# Patient Record
Sex: Male | Born: 1955 | Race: White | Hispanic: No | Marital: Married | Smoking: Former smoker
Health system: Southern US, Community
[De-identification: ages and names within clinical notes are randomized; demographics above are authoritative.]

## PROBLEM LIST (undated history)

## (undated) DIAGNOSIS — I10 Essential (primary) hypertension: Secondary | ICD-10-CM

## (undated) DIAGNOSIS — N200 Calculus of kidney: Secondary | ICD-10-CM

## (undated) DIAGNOSIS — R011 Cardiac murmur, unspecified: Secondary | ICD-10-CM

## (undated) DIAGNOSIS — M1990 Unspecified osteoarthritis, unspecified site: Secondary | ICD-10-CM

## (undated) DIAGNOSIS — K219 Gastro-esophageal reflux disease without esophagitis: Secondary | ICD-10-CM

## (undated) DIAGNOSIS — E119 Type 2 diabetes mellitus without complications: Secondary | ICD-10-CM

## (undated) HISTORY — PX: LITHOTRIPSY: SUR834

## (undated) HISTORY — PX: COLONOSCOPY: SHX174

---
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
## 2014-05-22 ENCOUNTER — Encounter (HOSPITAL_COMMUNITY): Payer: Self-pay | Admitting: Pharmacy Technician

## 2014-05-22 NOTE — Patient Instructions (Addendum)
Ricardo Contreras  05/22/2014   Your procedure is scheduled on:  05/30/14    Report to Cancer Center Entrance.    Follow the Signs to Short Stay Center at 0830       am  Call this number if you have problems the morning of surgery: [phone]   Remember: Eat a good healthy snack prior to bedtime.     Do not eat food or drink liquids after midnight.   Take these medicines the morning of surgery with A SIP OF WATER: Hydrocodone if needed    Do not wear jewelry,   Do not wear lotions, powders, or perfumes.  deodorant.  . Men may shave face and neck.  Do not bring valuables to the hospital.  Contacts, dentures or bridgework may not be worn into surgery.  Leave suitcase in the car. After surgery it may be brought to your room.  For patients admitted to the hospital, checkout time is 11:00 AM the day of  discharge.      [facility] - Preparing for Surgery Before surgery, you can play an important role.  Because skin is not sterile, your skin needs to be as free of germs as possible.  You can reduce the number of germs on your skin by washing with CHG (chlorahexidine gluconate) soap before surgery.  CHG is an antiseptic cleaner which kills germs and bonds with the skin to continue killing germs even after washing. Please DO NOT use if you have an allergy to CHG or antibacterial soaps.  If your skin becomes reddened/irritated stop using the CHG and inform your nurse when you arrive at Short Stay. Do not shave (including legs and underarms) for at least 48 hours prior to the first CHG shower.  You may shave your face/neck. Please follow these instructions carefully:  1.  Shower with CHG Soap the night before surgery and the  morning of Surgery.  2.  If you choose to wash your hair, wash your hair first as usual with your  normal  shampoo.  3.  After you shampoo, rinse your hair and body thoroughly to remove the  shampoo.                           4.  Use CHG as you would any other liquid soap.   You can apply chg directly  to the skin and wash                       Gently with a scrungie or clean washcloth.  5.  Apply the CHG Soap to your body ONLY FROM THE NECK DOWN.   Do not use on face/ open                           Wound or open sores. Avoid contact with eyes, ears mouth and genitals (private parts).                       Wash face,  Genitals (private parts) with your normal soap.             6.  Wash thoroughly, paying special attention to the area where your surgery  will be performed.  7.  Thoroughly rinse your body with warm water from the neck down.  8.  DO NOT shower/wash with your normal soap after using and rinsing off  the CHG Soap.                9.  Pat yourself dry with a clean towel.            10.  Wear clean pajamas.            11.  Place clean sheets on your bed the night of your first shower and do not  sleep with pets. Day of Surgery : Do not apply any lotions/deodorants the morning of surgery.  Please wear clean clothes to the hospital/surgery center.  FAILURE TO FOLLOW THESE INSTRUCTIONS MAY RESULT IN THE CANCELLATION OF YOUR SURGERY PATIENT SIGNATURE_________________________________  NURSE SIGNATURE__________________________________  ________________________________________________________________________  WHAT IS A BLOOD TRANSFUSION? Blood Transfusion Information  A transfusion is the replacement of blood or some of its parts. Blood is made up of multiple cells which provide different functions.  Red blood cells carry oxygen and are used for blood loss replacement.  White blood cells fight against infection.  Platelets control bleeding.  Plasma helps clot blood.  Other blood products are available for specialized needs, such as hemophilia or other clotting disorders. BEFORE THE TRANSFUSION  Who gives blood for transfusions?   Healthy volunteers who are fully evaluated to make sure their blood is safe. This is blood bank blood. Transfusion  therapy is the safest it has ever been in the practice of medicine. Before blood is taken from a donor, a complete history is taken to make sure that person has no history of diseases nor engages in risky social behavior (examples are intravenous drug use or sexual activity with multiple partners). The donor's travel history is screened to minimize risk of transmitting infections, such as malaria. The donated blood is tested for signs of infectious diseases, such as HIV and hepatitis. The blood is then tested to be sure it is compatible with you in order to minimize the chance of a transfusion reaction. If you or a relative donates blood, this is often done in anticipation of surgery and is not appropriate for emergency situations. It takes many days to process the donated blood. RISKS AND COMPLICATIONS Although transfusion therapy is very safe and saves many lives, the main dangers of transfusion include:   Getting an infectious disease.  Developing a transfusion reaction. This is an allergic reaction to something in the blood you were given. Every precaution is taken to prevent this. The decision to have a blood transfusion has been considered carefully by your caregiver before blood is given. Blood is not given unless the benefits outweigh the risks. AFTER THE TRANSFUSION  Right after receiving a blood transfusion, you will usually feel much better and more energetic. This is especially true if your red blood cells have gotten low (anemic). The transfusion raises the level of the red blood cells which carry oxygen, and this usually causes an energy increase.  The nurse administering the transfusion will monitor you carefully for complications. HOME CARE INSTRUCTIONS  No special instructions are needed after a transfusion. You may find your energy is better. Speak with your caregiver about any limitations on activity for underlying diseases you may have. SEEK MEDICAL CARE IF:   Your condition is  not improving after your transfusion.  You develop redness or irritation at the intravenous (IV) site. SEEK IMMEDIATE MEDICAL CARE IF:  Any of the following symptoms occur over the next 12 hours:  Shaking chills.  You have a temperature by mouth above 102 F (38.9 C), not controlled by  medicine.  Chest, back, or muscle pain.  People around you feel you are not acting correctly or are confused.  Shortness of breath or difficulty breathing.  Dizziness and fainting.  You get a rash or develop hives.  You have a decrease in urine output.  Your urine turns a dark color or changes to pink, red, or brown. Any of the following symptoms occur over the next 10 days:  You have a temperature by mouth above 102 F (38.9 C), not controlled by medicine.  Shortness of breath.  Weakness after normal activity.  The white part of the eye turns yellow (jaundice).  You have a decrease in the amount of urine or are urinating less often.  Your urine turns a dark color or changes to pink, red, or brown. Document Released: 08/01/2000 Document Revised: 10/27/2011 Document Reviewed: 03/20/2008 ExitCare Patient Information 2014 [place], [place].  _______________________________________________________________________  Incentive Spirometer  An incentive spirometer is a tool that can help keep your lungs clear and active. This tool measures how well you are filling your lungs with each breath. Taking long deep breaths may help reverse or decrease the chance of developing breathing (pulmonary) problems (especially infection) following:  A long period of time when you are unable to move or be active. BEFORE THE PROCEDURE   If the spirometer includes an indicator to show your best effort, your nurse or respiratory therapist will set it to a desired goal.  If possible, sit up straight or lean slightly forward. Try not to slouch.  Hold the incentive spirometer in an upright position. INSTRUCTIONS  FOR USE  1. Sit on the edge of your bed if possible, or sit up as far as you can in bed or on a chair. 2. Hold the incentive spirometer in an upright position. 3. Breathe out normally. 4. Place the mouthpiece in your mouth and seal your lips tightly around it. 5. Breathe in slowly and as deeply as possible, raising the piston or the ball toward the top of the column. 6. Hold your breath for 3-5 seconds or for as long as possible. Allow the piston or ball to fall to the bottom of the column. 7. Remove the mouthpiece from your mouth and breathe out normally. 8. Rest for a few seconds and repeat Steps 1 through 7 at least 10 times every 1-2 hours when you are awake. Take your time and take a few normal breaths between deep breaths. 9. The spirometer may include an indicator to show your best effort. Use the indicator as a goal to work toward during each repetition. 10. After each set of 10 deep breaths, practice coughing to be sure your lungs are clear. If you have an incision (the cut made at the time of surgery), support your incision when coughing by placing a pillow or rolled up towels firmly against it. Once you are able to get out of bed, walk around indoors and cough well. You may stop using the incentive spirometer when instructed by your caregiver.  RISKS AND COMPLICATIONS  Take your time so you do not get dizzy or light-headed.  If you are in pain, you may need to take or ask for pain medication before doing incentive spirometry. It is harder to take a deep breath if you are having pain. AFTER USE  Rest and breathe slowly and easily.  It can be helpful to keep track of a log of your progress. Your caregiver can provide you with a simple table to help with  this. If you are using the spirometer at home, follow these instructions: SEEK MEDICAL CARE IF:   You are having difficultly using the spirometer.  You have trouble using the spirometer as often as instructed.  Your pain  medication is not giving enough relief while using the spirometer.  You develop fever of 100.5 F (38.1 C) or higher. SEEK IMMEDIATE MEDICAL CARE IF:   You cough up bloody sputum that had not been present before.  You develop fever of 102 F (38.9 C) or greater.  You develop worsening pain at or near the incision site. MAKE SURE YOU:   Understand these instructions.  Will watch your condition.  Will get help right away if you are not doing well or get worse. Document Released: 12/15/2006 Document Revised: 10/27/2011 Document Reviewed: 02/15/2007 ExitCare Patient Information 2014 [employer], [place].   ________________________________________________________________________    Please read over the following fact sheets that you were given: MRSA Information, coughing and deep breathing exercises, leg exercises

## 2014-05-23 ENCOUNTER — Ambulatory Visit (HOSPITAL_COMMUNITY)
Admission: RE | Admit: 2014-05-23 | Discharge: 2014-05-23 | Disposition: A | Discharge status: Home / Self Care | Payer: BC Managed Care – PPO | Source: Ambulatory Visit | Attending: Orthopedic Surgery | Admitting: Orthopedic Surgery

## 2014-05-23 ENCOUNTER — Encounter (HOSPITAL_COMMUNITY)
Admission: RE | Admit: 2014-05-23 | Discharge: 2014-05-23 | Disposition: A | Discharge status: Home / Self Care | Payer: BC Managed Care – PPO | Source: Ambulatory Visit | Attending: Orthopedic Surgery | Admitting: Orthopedic Surgery

## 2014-05-23 ENCOUNTER — Encounter (HOSPITAL_COMMUNITY): Payer: Self-pay

## 2014-05-23 DIAGNOSIS — Z87891 Personal history of nicotine dependence: Secondary | ICD-10-CM | POA: Insufficient documentation

## 2014-05-23 DIAGNOSIS — Z01812 Encounter for preprocedural laboratory examination: Secondary | ICD-10-CM | POA: Insufficient documentation

## 2014-05-23 DIAGNOSIS — Z794 Long term (current) use of insulin: Secondary | ICD-10-CM | POA: Diagnosis not present

## 2014-05-23 DIAGNOSIS — M1611 Unilateral primary osteoarthritis, right hip: Secondary | ICD-10-CM | POA: Diagnosis present

## 2014-05-23 DIAGNOSIS — E119 Type 2 diabetes mellitus without complications: Secondary | ICD-10-CM | POA: Insufficient documentation

## 2014-05-23 DIAGNOSIS — I10 Essential (primary) hypertension: Secondary | ICD-10-CM | POA: Diagnosis not present

## 2014-05-23 DIAGNOSIS — Z01810 Encounter for preprocedural cardiovascular examination: Secondary | ICD-10-CM | POA: Insufficient documentation

## 2014-05-23 HISTORY — DX: Cardiac murmur, unspecified: R01.1

## 2014-05-23 HISTORY — DX: Essential (primary) hypertension: I10

## 2014-05-23 HISTORY — DX: Type 2 diabetes mellitus without complications: E11.9

## 2014-05-23 HISTORY — DX: Gastro-esophageal reflux disease without esophagitis: K21.9

## 2014-05-23 HISTORY — DX: Calculus of kidney: N20.0

## 2014-05-23 HISTORY — DX: Unspecified osteoarthritis, unspecified site: M19.90

## 2014-05-23 LAB — URINE MICROSCOPIC-ADD ON

## 2014-05-23 LAB — URINALYSIS, ROUTINE W REFLEX MICROSCOPIC
GLUCOSE, UA: NEGATIVE mg/dL
Ketones, ur: NEGATIVE mg/dL
Nitrite: NEGATIVE
PH: 5.5 (ref 5.0–8.0)
PROTEIN: NEGATIVE mg/dL
Specific Gravity, Urine: 1.016 (ref 1.005–1.030)
Urobilinogen, UA: 0.2 mg/dL (ref 0.0–1.0)

## 2014-05-23 LAB — BASIC METABOLIC PANEL
ANION GAP: 13 (ref 5–15)
BUN: 23 mg/dL (ref 6–23)
CO2: 25 mEq/L (ref 19–32)
Calcium: 9.7 mg/dL (ref 8.4–10.5)
Chloride: 98 mEq/L (ref 96–112)
Creatinine, Ser: 1.09 mg/dL (ref 0.50–1.35)
GFR, EST AFRICAN AMERICAN: 85 mL/min — AB (ref >90)
GFR, EST NON AFRICAN AMERICAN: 73 mL/min — AB (ref >90)
Glucose, Bld: 93 mg/dL (ref 70–99)
Potassium: 4.7 mEq/L (ref 3.7–5.3)
Sodium: 136 mEq/L — ABNORMAL LOW (ref 137–147)

## 2014-05-23 LAB — CBC
HCT: 39.9 % (ref 39.0–52.0)
HEMOGLOBIN: 14.1 g/dL (ref 13.0–17.0)
MCH: 32.7 pg (ref 26.0–34.0)
MCHC: 35.3 g/dL (ref 30.0–36.0)
MCV: 92.6 fL (ref 78.0–100.0)
Platelets: 158 10*3/uL (ref 150–400)
RBC: 4.31 MIL/uL (ref 4.22–5.81)
RDW: 13.4 % (ref 11.5–15.5)
WBC: 8 10*3/uL (ref 4.0–10.5)

## 2014-05-23 LAB — PROTIME-INR
INR: 1.04 (ref 0.00–1.49)
PROTHROMBIN TIME: 13.8 s (ref 11.6–15.2)

## 2014-05-23 LAB — APTT: aPTT: 36 seconds (ref 24–37)

## 2014-05-23 LAB — SURGICAL PCR SCREEN
MRSA, PCR: NEGATIVE
STAPHYLOCOCCUS AUREUS: NEGATIVE

## 2014-05-23 LAB — ABO/RH: ABO/RH(D): O NEG

## 2014-05-23 NOTE — Progress Notes (Signed)
At time of preop appointment done 05/23/2014 patient scored a "7" on STOP BANG TOOL for SLEEP APNEA which means patient is at an elevated risk .  Thank You.

## 2014-05-23 NOTE — Progress Notes (Signed)
U/A with micro results faxed via EPIC to Dr Charlann Boxerolin done 05/23/2014.

## 2014-05-23 NOTE — Progress Notes (Signed)
Dr Beckey DowningWillett 04/21/2014 clearance on chart.

## 2014-05-24 NOTE — Progress Notes (Signed)
[facility]

## 2014-05-27 NOTE — H&P (Signed)
TOTAL HIP ADMISSION H&P  Patient is admitted for right total hip arthroplasty, anterior approach.  Subjective:  Chief Complaint:   Right hip OA / pain  HPI: Ricardo Contreras, 58 y.o. male, has a history of pain and functional disability in the right hip(s) due to arthritis and patient has failed non-surgical conservative treatments for greater than 12 weeks to include NSAID's and/or analgesics, use of assistive devices and activity modification.  Onset of symptoms was gradual starting >10 years ago with gradually worsening course since that time.The patient noted no past surgery on the right hip(s).  Patient currently rates pain in the right hip at 8 out of 10 with activity. Patient has night pain, worsening of pain with activity and weight bearing, trendelenberg gait, pain that interfers with activities of daily living and pain with passive range of motion. Patient has evidence of periarticular osteophytes and joint space narrowing by imaging studies. This condition presents safety issues increasing the risk of falls. There is no current active infection.  Risks, benefits and expectations were discussed with the patient.  Risks including but not limited to the risk of anesthesia, blood clots, nerve damage, blood vessel damage, failure of the prosthesis, infection and up to and including death.  Patient understand the risks, benefits and expectations and wishes to proceed with surgery.   PCP: Mattie MarlinWILLETT,RALPH, MD  D/C Plans:      Home with HHPT  Post-op Meds:       No Rx given   Tranexamic Acid:      To be given - IV    Decadron:      Is to be given  FYI:     ASA post-op  Norco post-op    Past Medical History  Diagnosis Date  . Diabetes mellitus without complication   . Hypertension   . Heart murmur     hx of as a child   . Kidney stones   . GERD (gastroesophageal reflux disease)   . Arthritis     Past Surgical History  Procedure Laterality Date  . Tonsillectomy  1970  .  Lithotripsy    . Colonoscopy      No prescriptions prior to admission   No Known Allergies   History  Substance Use Topics  . Smoking status: Former Smoker    Quit date: 08/18/2000  . Smokeless tobacco: Never Used  . Alcohol Use: Yes     Comment: 5 days per week has a shot of whiskey    No family history on file.   Review of Systems  Constitutional: Negative.   HENT: Negative.   Eyes: Negative.   Respiratory: Negative.   Cardiovascular: Negative.   Gastrointestinal: Positive for heartburn.  Genitourinary: Negative.   Musculoskeletal: Positive for back pain and joint pain.  Skin: Negative.   Neurological: Negative.   Endo/Heme/Allergies: Negative.   Psychiatric/Behavioral: Negative.     Objective:  Physical Exam  Constitutional: He is oriented to person, place, and time. He appears well-developed and well-nourished.  HENT:  Head: Normocephalic and atraumatic.  Eyes: Pupils are equal, round, and reactive to light.  Neck: Neck supple. No JVD present. No tracheal deviation present. No thyromegaly present.  Cardiovascular: Normal rate, regular rhythm, normal heart sounds and intact distal pulses.   Respiratory: Effort normal and breath sounds normal. No respiratory distress. He has no wheezes.  GI: Soft. There is no tenderness. There is no guarding.  Musculoskeletal:       Right hip: He exhibits decreased range  of motion, decreased strength, tenderness and bony tenderness. He exhibits no swelling, no deformity and no laceration.  Lymphadenopathy:    He has no cervical adenopathy.  Neurological: He is alert and oriented to person, place, and time.  Skin: Skin is warm and dry.  Psychiatric: He has a normal mood and affect.      Imaging Review Plain radiographs demonstrate severe degenerative joint disease of the right hip(s). The bone quality appears to be good for age and reported activity level.  Assessment/Plan:  End stage arthritis, right hip(s)  The patient  history, physical examination, clinical judgement of the provider and imaging studies are consistent with end stage degenerative joint disease of the right hip(s) and total hip arthroplasty is deemed medically necessary. The treatment options including medical management, injection therapy, arthroscopy and arthroplasty were discussed at length. The risks and benefits of total hip arthroplasty were presented and reviewed. The risks due to aseptic loosening, infection, stiffness, dislocation/subluxation,  thromboembolic complications and other imponderables were discussed.  The patient acknowledged the explanation, agreed to proceed with the plan and consent was signed. Patient is being admitted for inpatient treatment for surgery, pain control, PT, OT, prophylactic antibiotics, VTE prophylaxis, progressive ambulation and ADL's and discharge planning.The patient is planning to be discharged home with home health services.     Anastasio AuerbachMatthew S. Ramond Darnell   PA-C  05/27/2014, 3:20 PM

## 2014-05-29 MED ORDER — DEXTROSE 5 % IV SOLN
3.0000 g | INTRAVENOUS | Status: AC
  Administered 2014-05-30: 3 g via INTRAVENOUS
  Filled 2014-05-29 (×2): qty 3000

## 2014-05-30 ENCOUNTER — Encounter (HOSPITAL_COMMUNITY): Payer: Self-pay | Admitting: *Deleted

## 2014-05-30 ENCOUNTER — Inpatient Hospital Stay (HOSPITAL_COMMUNITY)
Admission: RE | Admit: 2014-05-30 | Discharge: 2014-05-31 | DRG: 470 | Disposition: A | Discharge status: Home Health | Payer: BC Managed Care – PPO | Source: Ambulatory Visit | Attending: Orthopedic Surgery | Admitting: Orthopedic Surgery

## 2014-05-30 ENCOUNTER — Encounter (HOSPITAL_COMMUNITY): Payer: BC Managed Care – PPO | Admitting: Anesthesiology

## 2014-05-30 ENCOUNTER — Encounter (HOSPITAL_COMMUNITY)
Admission: RE | Disposition: A | Discharge status: Home Health | Payer: Self-pay | Source: Ambulatory Visit | Attending: Orthopedic Surgery

## 2014-05-30 ENCOUNTER — Inpatient Hospital Stay (HOSPITAL_COMMUNITY): Payer: BC Managed Care – PPO

## 2014-05-30 ENCOUNTER — Inpatient Hospital Stay (HOSPITAL_COMMUNITY): Payer: BC Managed Care – PPO | Admitting: Anesthesiology

## 2014-05-30 DIAGNOSIS — M1611 Unilateral primary osteoarthritis, right hip: Secondary | ICD-10-CM | POA: Diagnosis present

## 2014-05-30 DIAGNOSIS — Z87891 Personal history of nicotine dependence: Secondary | ICD-10-CM

## 2014-05-30 DIAGNOSIS — Z96649 Presence of unspecified artificial hip joint: Secondary | ICD-10-CM

## 2014-05-30 DIAGNOSIS — K219 Gastro-esophageal reflux disease without esophagitis: Secondary | ICD-10-CM | POA: Diagnosis present

## 2014-05-30 DIAGNOSIS — Z87442 Personal history of urinary calculi: Secondary | ICD-10-CM

## 2014-05-30 DIAGNOSIS — E119 Type 2 diabetes mellitus without complications: Secondary | ICD-10-CM | POA: Diagnosis present

## 2014-05-30 DIAGNOSIS — I10 Essential (primary) hypertension: Secondary | ICD-10-CM | POA: Diagnosis present

## 2014-05-30 DIAGNOSIS — Z6841 Body Mass Index (BMI) 40.0 and over, adult: Secondary | ICD-10-CM | POA: Diagnosis not present

## 2014-05-30 DIAGNOSIS — M25551 Pain in right hip: Secondary | ICD-10-CM | POA: Diagnosis present

## 2014-05-30 HISTORY — PX: TOTAL HIP ARTHROPLASTY: SHX124

## 2014-05-30 LAB — GLUCOSE, CAPILLARY
GLUCOSE-CAPILLARY: 129 mg/dL — AB (ref 70–99)
Glucose-Capillary: 157 mg/dL — ABNORMAL HIGH (ref 70–99)
Glucose-Capillary: 186 mg/dL — ABNORMAL HIGH (ref 70–99)

## 2014-05-30 LAB — TYPE AND SCREEN
ABO/RH(D): O NEG
Antibody Screen: NEGATIVE

## 2014-05-30 SURGERY — ARTHROPLASTY, HIP, TOTAL, ANTERIOR APPROACH
Anesthesia: General | Site: Hip | Laterality: Right

## 2014-05-30 MED ORDER — ONDANSETRON HCL 4 MG/2ML IJ SOLN: 4.0000 mg | Freq: Four times a day (QID) | INTRAMUSCULAR | Status: DC | PRN

## 2014-05-30 MED ORDER — MIDAZOLAM HCL 2 MG/2ML IJ SOLN
INTRAMUSCULAR | Status: AC
  Filled 2014-05-30: qty 2

## 2014-05-30 MED ORDER — PROPOFOL 10 MG/ML IV BOLUS
INTRAVENOUS | Status: AC
  Filled 2014-05-30: qty 20

## 2014-05-30 MED ORDER — METHOCARBAMOL 500 MG PO TABS
500.0000 mg | ORAL_TABLET | Freq: Four times a day (QID) | ORAL | Status: DC | PRN
  Administered 2014-05-30 – 2014-05-31 (×2): 500 mg via ORAL
  Filled 2014-05-30 (×2): qty 1

## 2014-05-30 MED ORDER — DOCUSATE SODIUM 100 MG PO CAPS
100.0000 mg | ORAL_CAPSULE | Freq: Two times a day (BID) | ORAL | Status: DC
  Administered 2014-05-30 – 2014-05-31 (×2): 100 mg via ORAL

## 2014-05-30 MED ORDER — METOCLOPRAMIDE HCL 5 MG/ML IJ SOLN: 5.0000 mg | Freq: Three times a day (TID) | INTRAMUSCULAR | Status: DC | PRN

## 2014-05-30 MED ORDER — PROPOFOL INFUSION 10 MG/ML OPTIME
INTRAVENOUS | Status: DC | PRN
  Administered 2014-05-30: 100 ug/kg/min via INTRAVENOUS

## 2014-05-30 MED ORDER — METHOCARBAMOL 1000 MG/10ML IJ SOLN
500.0000 mg | Freq: Four times a day (QID) | INTRAVENOUS | Status: DC | PRN
  Administered 2014-05-30: 500 mg via INTRAVENOUS
  Filled 2014-05-30: qty 5

## 2014-05-30 MED ORDER — FERROUS SULFATE 325 (65 FE) MG PO TABS
325.0000 mg | ORAL_TABLET | Freq: Three times a day (TID) | ORAL | Status: DC
Start: 2014-05-30 — End: 2014-05-31
  Administered 2014-05-30 – 2014-05-31 (×2): 325 mg via ORAL
  Filled 2014-05-30 (×5): qty 1

## 2014-05-30 MED ORDER — PROMETHAZINE HCL 25 MG/ML IJ SOLN: 6.2500 mg | INTRAMUSCULAR | Status: DC | PRN

## 2014-05-30 MED ORDER — FENTANYL CITRATE 0.05 MG/ML IJ SOLN
INTRAMUSCULAR | Status: AC
  Filled 2014-05-30: qty 2

## 2014-05-30 MED ORDER — INSULIN ASPART 100 UNIT/ML ~~LOC~~ SOLN
4.0000 [IU] | Freq: Three times a day (TID) | SUBCUTANEOUS | Status: DC
  Administered 2014-05-30 – 2014-05-31 (×2): 4 [IU] via SUBCUTANEOUS

## 2014-05-30 MED ORDER — GEMFIBROZIL 600 MG PO TABS
600.0000 mg | ORAL_TABLET | Freq: Two times a day (BID) | ORAL | Status: DC
  Administered 2014-05-30 – 2014-05-31 (×2): 600 mg via ORAL
  Filled 2014-05-30 (×4): qty 1

## 2014-05-30 MED ORDER — FENTANYL CITRATE 0.05 MG/ML IJ SOLN
25.0000 ug | INTRAMUSCULAR | Status: DC | PRN
  Administered 2014-05-30 (×2): 50 ug via INTRAVENOUS

## 2014-05-30 MED ORDER — METFORMIN HCL 500 MG PO TABS
1000.0000 mg | ORAL_TABLET | Freq: Two times a day (BID) | ORAL | Status: DC
  Administered 2014-05-30 – 2014-05-31 (×2): 1000 mg via ORAL
  Filled 2014-05-30 (×4): qty 2

## 2014-05-30 MED ORDER — METOCLOPRAMIDE HCL 10 MG PO TABS: 5.0000 mg | ORAL_TABLET | Freq: Three times a day (TID) | ORAL | Status: DC | PRN

## 2014-05-30 MED ORDER — OXYCODONE HCL 5 MG PO TABS: 5.0000 mg | ORAL_TABLET | ORAL | Status: DC | PRN

## 2014-05-30 MED ORDER — DIPHENHYDRAMINE HCL 25 MG PO CAPS: 25.0000 mg | ORAL_CAPSULE | Freq: Four times a day (QID) | ORAL | Status: DC | PRN

## 2014-05-30 MED ORDER — DEXAMETHASONE SODIUM PHOSPHATE 10 MG/ML IJ SOLN
INTRAMUSCULAR | Status: AC
  Filled 2014-05-30: qty 1

## 2014-05-30 MED ORDER — ONDANSETRON HCL 4 MG PO TABS: 4.0000 mg | ORAL_TABLET | Freq: Four times a day (QID) | ORAL | Status: DC | PRN

## 2014-05-30 MED ORDER — LACTATED RINGERS IV SOLN: INTRAVENOUS | Status: DC

## 2014-05-30 MED ORDER — ZOLPIDEM TARTRATE 5 MG PO TABS: 5.0000 mg | ORAL_TABLET | Freq: Every evening | ORAL | Status: DC | PRN

## 2014-05-30 MED ORDER — ACETAMINOPHEN 325 MG PO TABS: 650.0000 mg | ORAL_TABLET | Freq: Four times a day (QID) | ORAL | Status: DC | PRN

## 2014-05-30 MED ORDER — SODIUM CHLORIDE 0.9 % IR SOLN
Status: DC | PRN
  Administered 2014-05-30: 1000 mL

## 2014-05-30 MED ORDER — ASPIRIN EC 325 MG PO TBEC
325.0000 mg | DELAYED_RELEASE_TABLET | Freq: Two times a day (BID) | ORAL | Status: DC
  Administered 2014-05-31: 325 mg via ORAL
  Filled 2014-05-30 (×3): qty 1

## 2014-05-30 MED ORDER — MAGNESIUM CITRATE PO SOLN: 1.0000 | Freq: Once | ORAL | Status: AC | PRN

## 2014-05-30 MED ORDER — HYDROMORPHONE HCL 1 MG/ML IJ SOLN
0.5000 mg | INTRAMUSCULAR | Status: DC | PRN
  Administered 2014-05-30: 1 mg via INTRAVENOUS
  Filled 2014-05-30: qty 1

## 2014-05-30 MED ORDER — INSULIN ASPART 100 UNIT/ML ~~LOC~~ SOLN
0.0000 [IU] | Freq: Three times a day (TID) | SUBCUTANEOUS | Status: DC
  Administered 2014-05-30 – 2014-05-31 (×2): 3 [IU] via SUBCUTANEOUS

## 2014-05-30 MED ORDER — SODIUM CHLORIDE 0.9 % IV SOLN
1000.0000 mg | Freq: Once | INTRAVENOUS | Status: AC
  Administered 2014-05-30: 1000 mg via INTRAVENOUS
  Filled 2014-05-30: qty 10

## 2014-05-30 MED ORDER — CHLORHEXIDINE GLUCONATE 4 % EX LIQD: 60.0000 mL | Freq: Once | CUTANEOUS | Status: DC

## 2014-05-30 MED ORDER — CEFAZOLIN SODIUM-DEXTROSE 2-3 GM-% IV SOLR
2.0000 g | Freq: Four times a day (QID) | INTRAVENOUS | Status: AC
  Administered 2014-05-30 (×2): 2 g via INTRAVENOUS
  Filled 2014-05-30 (×2): qty 50

## 2014-05-30 MED ORDER — FENTANYL CITRATE 0.05 MG/ML IJ SOLN
INTRAMUSCULAR | Status: DC | PRN
  Administered 2014-05-30: 50 ug via INTRAVENOUS

## 2014-05-30 MED ORDER — LORATADINE 10 MG PO TABS
10.0000 mg | ORAL_TABLET | Freq: Every day | ORAL | Status: DC
  Administered 2014-05-30 – 2014-05-31 (×2): 10 mg via ORAL
  Filled 2014-05-30 (×2): qty 1

## 2014-05-30 MED ORDER — ACETAMINOPHEN 500 MG PO TABS: 1000.0000 mg | ORAL_TABLET | Freq: Four times a day (QID) | ORAL | Status: DC

## 2014-05-30 MED ORDER — BISACODYL 10 MG RE SUPP: 10.0000 mg | Freq: Every day | RECTAL | Status: DC | PRN

## 2014-05-30 MED ORDER — PROPOFOL 10 MG/ML IV BOLUS
INTRAVENOUS | Status: DC | PRN
  Administered 2014-05-30 (×2): 20 mg via INTRAVENOUS

## 2014-05-30 MED ORDER — ALUM & MAG HYDROXIDE-SIMETH 200-200-20 MG/5ML PO SUSP: 30.0000 mL | ORAL | Status: DC | PRN

## 2014-05-30 MED ORDER — ONDANSETRON HCL 4 MG/2ML IJ SOLN
INTRAMUSCULAR | Status: AC
Start: 2014-05-30 — End: 2014-05-30
  Filled 2014-05-30: qty 2

## 2014-05-30 MED ORDER — PHENOL 1.4 % MT LIQD
1.0000 | OROMUCOSAL | Status: DC | PRN
  Filled 2014-05-30: qty 177

## 2014-05-30 MED ORDER — MIDAZOLAM HCL 5 MG/5ML IJ SOLN
INTRAMUSCULAR | Status: DC | PRN
  Administered 2014-05-30: 2 mg via INTRAVENOUS

## 2014-05-30 MED ORDER — CELECOXIB 200 MG PO CAPS
200.0000 mg | ORAL_CAPSULE | Freq: Two times a day (BID) | ORAL | Status: DC
  Administered 2014-05-30 – 2014-05-31 (×2): 200 mg via ORAL
  Filled 2014-05-30 (×4): qty 1

## 2014-05-30 MED ORDER — LIDOCAINE HCL (CARDIAC) 20 MG/ML IV SOLN
INTRAVENOUS | Status: DC | PRN
  Administered 2014-05-30: 20 mg via INTRAVENOUS

## 2014-05-30 MED ORDER — LINAGLIPTIN 5 MG PO TABS
5.0000 mg | ORAL_TABLET | Freq: Every day | ORAL | Status: DC
Start: 2014-05-30 — End: 2014-05-31
  Administered 2014-05-30 – 2014-05-31 (×2): 5 mg via ORAL
  Filled 2014-05-30 (×2): qty 1

## 2014-05-30 MED ORDER — ONDANSETRON HCL 4 MG/2ML IJ SOLN
INTRAMUSCULAR | Status: DC | PRN
  Administered 2014-05-30: 4 mg via INTRAVENOUS

## 2014-05-30 MED ORDER — DEXAMETHASONE SODIUM PHOSPHATE 10 MG/ML IJ SOLN
10.0000 mg | Freq: Once | INTRAMUSCULAR | Status: AC
  Administered 2014-05-30: 10 mg via INTRAVENOUS

## 2014-05-30 MED ORDER — SODIUM CHLORIDE 0.9 % IV SOLN
INTRAVENOUS | Status: DC
  Administered 2014-05-30: 19:00:00 via INTRAVENOUS
  Filled 2014-05-30 (×3): qty 1000

## 2014-05-30 MED ORDER — MENTHOL 3 MG MT LOZG
1.0000 | LOZENGE | OROMUCOSAL | Status: DC | PRN
  Filled 2014-05-30: qty 9

## 2014-05-30 MED ORDER — ACETAMINOPHEN 650 MG RE SUPP: 650.0000 mg | Freq: Four times a day (QID) | RECTAL | Status: DC | PRN

## 2014-05-30 MED ORDER — DEXAMETHASONE SODIUM PHOSPHATE 10 MG/ML IJ SOLN
10.0000 mg | Freq: Once | INTRAMUSCULAR | Status: AC
  Administered 2014-05-31: 10 mg via INTRAVENOUS
  Filled 2014-05-30: qty 1

## 2014-05-30 MED ORDER — BUPIVACAINE HCL (PF) 0.5 % IJ SOLN
INTRAMUSCULAR | Status: DC | PRN
  Administered 2014-05-30: 3 mL

## 2014-05-30 MED ORDER — INSULIN ASPART 100 UNIT/ML ~~LOC~~ SOLN
0.0000 [IU] | Freq: Every day | SUBCUTANEOUS | Status: DC
  Administered 2014-05-30: 2 [IU] via SUBCUTANEOUS

## 2014-05-30 MED ORDER — HYDROCODONE-ACETAMINOPHEN 7.5-325 MG PO TABS
1.0000 | ORAL_TABLET | ORAL | Status: DC
  Administered 2014-05-30 – 2014-05-31 (×6): 2 via ORAL
  Filled 2014-05-30 (×6): qty 2

## 2014-05-30 MED ORDER — BUPIVACAINE HCL (PF) 0.5 % IJ SOLN
INTRAMUSCULAR | Status: AC
  Filled 2014-05-30: qty 30

## 2014-05-30 MED ORDER — PREGABALIN 50 MG PO CAPS
50.0000 mg | ORAL_CAPSULE | Freq: Two times a day (BID) | ORAL | Status: DC
  Administered 2014-05-30 – 2014-05-31 (×2): 50 mg via ORAL
  Filled 2014-05-30 (×2): qty 1

## 2014-05-30 MED ORDER — POLYETHYLENE GLYCOL 3350 17 G PO PACK: 17.0000 g | PACK | Freq: Every day | ORAL | Status: DC | PRN

## 2014-05-30 MED ORDER — LACTATED RINGERS IV SOLN
INTRAVENOUS | Status: DC | PRN
  Administered 2014-05-30 (×3): via INTRAVENOUS

## 2014-05-30 MED ORDER — INFLUENZA VAC SPLIT QUAD 0.5 ML IM SUSY
0.5000 mL | PREFILLED_SYRINGE | INTRAMUSCULAR | Status: DC
  Filled 2014-05-30 (×2): qty 0.5

## 2014-05-30 SURGICAL SUPPLY — 37 items
BAG ZIPLOCK 12X15 (MISCELLANEOUS) IMPLANT
CAPT HIP PF MOP ×3 IMPLANT
COVER PERINEAL POST (MISCELLANEOUS) ×3 IMPLANT
DERMABOND ADVANCED (GAUZE/BANDAGES/DRESSINGS) ×2
DERMABOND ADVANCED .7 DNX12 (GAUZE/BANDAGES/DRESSINGS) ×1 IMPLANT
DRAPE C-ARM 42X120 X-RAY (DRAPES) ×3 IMPLANT
DRAPE STERI IOBAN 125X83 (DRAPES) ×3 IMPLANT
DRAPE U-SHAPE 47X51 STRL (DRAPES) ×9 IMPLANT
DRSG AQUACEL AG ADV 3.5X10 (GAUZE/BANDAGES/DRESSINGS) ×3 IMPLANT
DURAPREP 26ML APPLICATOR (WOUND CARE) ×3 IMPLANT
ELECT BLADE TIP CTD 4 INCH (ELECTRODE) ×3 IMPLANT
ELECT PENCIL ROCKER SW 15FT (MISCELLANEOUS) IMPLANT
ELECT REM PT RETURN 15FT ADLT (MISCELLANEOUS) IMPLANT
ELECT REM PT RETURN 9FT ADLT (ELECTROSURGICAL) ×3
ELECTRODE REM PT RTRN 9FT ADLT (ELECTROSURGICAL) ×1 IMPLANT
FACESHIELD WRAPAROUND (MASK) ×12 IMPLANT
GLOVE BIOGEL PI IND STRL 7.5 (GLOVE) ×1 IMPLANT
GLOVE BIOGEL PI IND STRL 8.5 (GLOVE) ×1 IMPLANT
GLOVE BIOGEL PI INDICATOR 7.5 (GLOVE) ×2
GLOVE BIOGEL PI INDICATOR 8.5 (GLOVE) ×2
GLOVE ECLIPSE 8.0 STRL XLNG CF (GLOVE) ×3 IMPLANT
GLOVE ORTHO TXT STRL SZ7.5 (GLOVE) ×6 IMPLANT
GOWN SPEC L3 XXLG W/TWL (GOWN DISPOSABLE) ×3 IMPLANT
GOWN STRL REUS W/TWL LRG LVL3 (GOWN DISPOSABLE) ×3 IMPLANT
HOLDER FOLEY CATH W/STRAP (MISCELLANEOUS) ×3 IMPLANT
KIT BASIN OR (CUSTOM PROCEDURE TRAY) ×3 IMPLANT
PACK TOTAL JOINT (CUSTOM PROCEDURE TRAY) ×3 IMPLANT
SAW OSC TIP CART 19.5X105X1.3 (SAW) ×3 IMPLANT
SUT MNCRL AB 4-0 PS2 18 (SUTURE) ×3 IMPLANT
SUT VIC AB 1 CT1 36 (SUTURE) ×9 IMPLANT
SUT VIC AB 2-0 CT1 27 (SUTURE) ×6
SUT VIC AB 2-0 CT1 TAPERPNT 27 (SUTURE) ×3 IMPLANT
SUT VLOC 180 0 24IN GS25 (SUTURE) ×3 IMPLANT
TOWEL OR 17X26 10 PK STRL BLUE (TOWEL DISPOSABLE) ×3 IMPLANT
TOWEL OR NON WOVEN STRL DISP B (DISPOSABLE) IMPLANT
TRAY FOLEY CATH 14FRSI W/METER (CATHETERS) ×3 IMPLANT
WATER STERILE IRR 1500ML POUR (IV SOLUTION) ×3 IMPLANT

## 2014-05-30 NOTE — Interval H&P Note (Signed)
History and Physical Interval Note:  05/30/2014 9:33 AM  Ricardo MasonAlbert L Contreras  has presented today for surgery, with the diagnosis of RIGHT HIP OA  The various methods of treatment have been discussed with the patient and family. After consideration of risks, benefits and other options for treatment, the patient has consented to  Procedure(s): RIGHT TOTAL HIP ARTHROPLASTY ANTERIOR APPROACH (Right) as a surgical intervention .  The patient's history has been reviewed, patient examined, no change in status, stable for surgery.  I have reviewed the patient's chart and labs.  Questions were answered to the patient's satisfaction.     [name],Nikolaus Pienta D

## 2014-05-30 NOTE — Evaluation (Signed)
Physical Therapy Evaluation Patient Details Name: Ricardo Contreras MRN: [id number] DOB: 17-Dec-1955 Today's Date: 05/30/2014   History of Present Illness  58 yo male s/p R THA-DA 05/30/14  Clinical Impression  On eval, pt required Min assist for mobility-able to ambulate ~60 feet with rolling walker. Tolerated activity well. Plan is possibly for d/c home on tomorrow.     Follow Up Recommendations Home health PT    Equipment Recommendations  Rolling walker with 5" wheels (bariatric)    Recommendations for Other Services OT consult     Precautions / Restrictions Precautions Precautions: Fall Restrictions Weight Bearing Restrictions: No RLE Weight Bearing: Weight bearing as tolerated      Mobility  Bed Mobility Overal bed mobility: Needs Assistance Bed Mobility: Supine to Sit     Supine to sit: Min assist     General bed mobility comments: assist for R LE  Transfers Overall transfer level: Needs assistance Equipment used: Rolling walker (2 wheeled) Transfers: Sit to/from Stand Sit to Stand: Min assist         General transfer comment: assist to rise, stabilize, control descent. VCs safety, hand placement  Ambulation/Gait Ambulation/Gait assistance: Min guard Ambulation Distance (Feet): 60 Feet Assistive device: Rolling walker (2 wheeled) Gait Pattern/deviations: Step-to pattern;Decreased stride length;Antalgic     General Gait Details: slow gait speed. VCS safety, technique, sequence  Stairs            Wheelchair Mobility    Modified Rankin (Stroke Patients Only)       Balance                                             Pertinent Vitals/Pain Pain Assessment: 0-10 Pain Score: 5  Pain Location: R hip Pain Intervention(s): Monitored during session;Ice applied    Home Living Family/patient expects to be discharged to:: Private residence Living Arrangements: Spouse/significant other   Type of Home: House Home Access:  Stairs to enter   Secretary/administratorntrance Stairs-Number of Steps: 2 Home Layout: One level Home Equipment: Walker - standard;Cane - single point      Prior Function Level of Independence: Independent               Hand Dominance        Extremity/Trunk Assessment   Upper Extremity Assessment: Overall WFL for tasks assessed           Lower Extremity Assessment: RLE deficits/detail RLE Deficits / Details: hip flex 2/5, hip abd/add 2/5, moves ankle well    Cervical / Trunk Assessment: Normal  Communication   Communication: No difficulties  Cognition Arousal/Alertness: Awake/alert Behavior During Therapy: WFL for tasks assessed/performed Overall Cognitive Status: Within Functional Limits for tasks assessed                      General Comments      Exercises        Assessment/Plan    PT Assessment Patient needs continued PT services  PT Diagnosis Difficulty walking;Acute pain   PT Problem List Decreased strength;Decreased range of motion;Decreased activity tolerance;Decreased balance;Obesity;Decreased knowledge of use of DME;Pain  PT Treatment Interventions DME instruction;Gait training;Stair training;Functional mobility training;Therapeutic activities;Therapeutic exercise;Patient/family education;Balance training   PT Goals (Current goals can be found in the Care Plan section) Acute Rehab PT Goals Patient Stated Goal: home tomorrow PT Goal Formulation: With patient/family Time For Goal Achievement:  06/06/14 Potential to Achieve Goals: Good    Frequency 7X/week   Barriers to discharge        Co-evaluation               End of Session   Activity Tolerance: Patient tolerated treatment well Patient left: in chair;with call bell/phone within reach;with family/visitor present           Time: 1738-1800 PT Time Calculation (min): 22 min   Charges:   PT Evaluation $Initial PT Evaluation Tier I: 1 Procedure PT Treatments $Gait Training: 8-22  mins   PT G Codes:          Rebeca AlertJannie Arilyn Brierley, MPT Pager: [phone]

## 2014-05-30 NOTE — Anesthesia Preprocedure Evaluation (Signed)
Anesthesia Evaluation  Patient identified by MRN, date of birth, ID band Patient awake    Reviewed: Allergy & Precautions, H&P , NPO status , Patient's Chart, lab work & pertinent test results  History of Anesthesia Complications Negative for: history of anesthetic complications  Airway Mallampati: II TM Distance: >3 FB Neck ROM: Full    Dental no notable dental hx.    Pulmonary former smoker,  breath sounds clear to auscultation  Pulmonary exam normal       Cardiovascular hypertension, Pt. on medications negative cardio ROS  + Valvular Problems/Murmurs Rhythm:Regular Rate:Normal     Neuro/Psych negative neurological ROS  negative psych ROS   GI/Hepatic Neg liver ROS, GERD-  Medicated and Controlled,  Endo/Other  diabetes, Type 2, Oral Hypoglycemic Agents  Renal/GU negative Renal ROS  negative genitourinary   Musculoskeletal  (+) Arthritis -, Osteoarthritis,    Abdominal   Peds negative pediatric ROS (+)  Hematology negative hematology ROS (+)   Anesthesia Other Findings   Reproductive/Obstetrics negative OB ROS                           Anesthesia Physical Anesthesia Plan  ASA: II  Anesthesia Plan: General and Spinal   Post-op Pain Management:    Induction: Intravenous  Airway Management Planned: Oral ETT and Nasal Cannula  Additional Equipment:   Intra-op Plan:   Post-operative Plan: Extubation in OR  Informed Consent: I have reviewed the patients History and Physical, chart, labs and discussed the procedure including the risks, benefits and alternatives for the proposed anesthesia with the patient or authorized representative who has indicated his/her understanding and acceptance.   Dental advisory given  Plan Discussed with: CRNA  Anesthesia Plan Comments:         Anesthesia Quick Evaluation

## 2014-05-30 NOTE — Op Note (Signed)
NAME:  Ricardo Contreras Sc[place]                ACCOUNT NO.: [id number]      MEDICAL RECORD NO.: [id number]      [facility]      PHYSICIAN:  Durene RomansLIN,Kaheem Halleck D  DATE OF BIRTH:  Dec 24, 1955     DATE OF PROCEDURE:  05/30/2014                                 OPERATIVE REPORT         PREOPERATIVE DIAGNOSIS: Right  hip osteoarthritis.      POSTOPERATIVE DIAGNOSIS:  Right hip osteoarthritis.      PROCEDURE:  Right total hip replacement through an anterior approach   utilizing DePuy THR system, component size 56mm pinnacle cup, a size 36+4 neutral   Altrex liner, a size 6 Hi Tri Lock stem with a 36+1.5 Articuleze metal head ball.      SURGEON:  Madlyn FrankelMatthew D. Charlann Boxerlin, M.D.      ASSISTANT:  Skip MayerBlair Roberts, PA-C     ANESTHESIA:  Spinal.      SPECIMENS:  None.      COMPLICATIONS:  None.      BLOOD LOSS:  900 cc     DRAINS:  None.      INDICATION OF THE PROCEDURE:  Ricardo Contreras Beyene is a 58 y.o. male who had   presented to office for evaluation of right hip pain.  Radiographs revealed   progressive degenerative changes with bone-on-bone   articulation to the  hip joint.  The patient had painful limited range of   motion significantly affecting their overall quality of life.  The patient was failing to    respond to conservative measures, and at this point was ready   to proceed with more definitive measures.  The patient has noted progressive   degenerative changes in his hip, progressive problems and dysfunction   with regarding the hip prior to surgery.  Consent was obtained for   benefit of pain relief.  Specific risk of infection, DVT, component   failure, dislocation, need for revision surgery, as well discussion of   the anterior versus posterior approach were reviewed.  Consent was   obtained for benefit of anterior pain relief through an anterior   approach.      PROCEDURE IN DETAIL:  The patient was brought to operative theater.   Once adequate  anesthesia, preoperative antibiotics, 3gm of Ancef administered.   The patient was positioned supine on the OSI Hanna table.  Once adequate   padding of boney process was carried out, we had predraped out the hip, and  used fluoroscopy to confirm orientation of the pelvis and position.      The right hip was then prepped and draped from proximal iliac crest to   mid thigh with shower curtain technique.      Time-out was performed identifying the patient, planned procedure, and   extremity.     An incision was then made 2 cm distal and lateral to the   anterior superior iliac spine extending over the orientation of the   tensor fascia lata muscle and sharp dissection was carried down to the   fascia of the muscle and protractor placed in the soft tissues.      The fascia was then incised.  The muscle belly was identified and swept  laterally and retractor placed along the superior neck.  Following   cauterization of the circumflex vessels and removing some pericapsular   fat, a second cobra retractor was placed on the inferior neck.  A third   retractor was placed on the anterior acetabulum after elevating the   anterior rectus.  A Contreras-capsulotomy was along the line of the   superior neck to the trochanteric fossa, then extended proximally and   distally.  Tag sutures were placed and the retractors were then placed   intracapsular.  We then identified the trochanteric fossa and   orientation of my neck cut, confirmed this radiographically   and then made a neck osteotomy with the femur on traction.  The femoral   head was removed without difficulty or complication.  Traction was let   off and retractors were placed posterior and anterior around the   acetabulum.      The labrum and foveal tissue were debrided.  I began reaming with a 49mm   reamer and reamed up to 55mm reamer with good bony bed preparation and a 56mm   cup was chosen.  The final 56mm Pinnacle cup was then impacted  under fluoroscopy  to confirm the depth of penetration and orientation with respect to   abduction.  A screw was placed followed by the hole eliminator.  The final   36+4 neutral Altrex liner was impacted with good visualized rim fit.  The cup was positioned anatomically within the acetabular portion of the pelvis.      At this point, the femur was rolled at 80 degrees.  Further capsule was   released off the inferior aspect of the femoral neck.  I then   released the superior capsule proximally.  The hook was placed laterally   along the femur and elevated manually and held in position with the bed   hook.  The leg was then extended and adducted with the leg rolled to 100   degrees of external rotation.  Once the proximal femur was fully   exposed, I used a box osteotome to set orientation.  I then began   broaching with the starting chili pepper broach and passed this by hand and then broached up to 6.  With the 6 broach in place I chose a high offset neck and did a trial reduction.  The offset was appropriate, leg lengths   appeared to be equal, confirmed radiographically.   Given these findings, I went ahead and dislocated the hip, repositioned all   retractors and positioned the right hip in the extended and abducted position.  The final 6 HiTri Lock stem was   chosen and it was impacted down to the level of neck cut.  Based on this   and the trial reduction, a 36+1.5 Articuleze metal ball was chosen and   impacted onto a clean and dry trunnion, and the hip was reduced.  The   hip had been irrigated throughout the case again at this point.  The fascia of the   tensor fascia lata muscle was then reapproximated using #1 Vicryl and # V-lock.  The   remaining wound was closed with 2-0 Vicryl and running 4-0 Monocryl.   The hip was cleaned, dried, and dressed sterilely using Dermabond and   Aquacel dressing.  He was then brought   to recovery room in stable condition tolerating the procedure  well.    Skip MayerBlair Roberts, PA-C was present for the entirety of the case  involved from   preoperative positioning, perioperative retractor management, general   facilitation of the case, as well as primary wound closure as assistant.            Madlyn Frankel Charlann Boxer, M.D.        05/30/2014 12:25 PM

## 2014-05-30 NOTE — Anesthesia Postprocedure Evaluation (Signed)
  Anesthesia Post-op Note  Patient: Ricardo Contreras  Procedure(s) Performed: Procedure(s) (LRB): RIGHT TOTAL HIP ARTHROPLASTY ANTERIOR APPROACH (Right)  Patient Location: PACU  Anesthesia Type: Spinal  Level of Consciousness: awake and alert   Airway and Oxygen Therapy: Patient Spontanous Breathing  Post-op Pain: mild  Post-op Assessment: Post-op Vital signs reviewed, Patient's Cardiovascular Status Stable, Respiratory Function Stable, Patent Airway and No signs of Nausea or vomiting  Last Vitals:  Filed Vitals:   05/30/14 1415  BP: 127/69  Pulse: 67  Temp: 36.9 C  Resp: 14    Post-op Vital Signs: stable   Complications: No apparent anesthesia complications

## 2014-05-30 NOTE — Transfer of Care (Signed)
Immediate Anesthesia Transfer of Care Note  Patient: Ricardo Contreras  Procedure(s) Performed: Procedure(s) (LRB): RIGHT TOTAL HIP ARTHROPLASTY ANTERIOR APPROACH (Right)  Patient Location: PACU  Anesthesia Type: Spinal  Level of Consciousness: sedated, patient cooperative and responds to stimulation  Airway & Oxygen Therapy: Patient Spontanous Breathing and Patient connected to face mask oxgen  Post-op Assessment: Report given to PACU RN and Post -op Vital signs reviewed and stable  Post vital signs: Reviewed and stable  Complications: No apparent anesthesia complications

## 2014-05-30 NOTE — Anesthesia Procedure Notes (Signed)
Spinal  Patient location during procedure: OR Start time: 05/30/2014 10:42 AM End time: 05/30/2014 10:46 AM Staffing Anesthesiologist: Milana Obey CRNA/Resident: Lajuana Carry E Performed by: resident/CRNA  Preanesthetic Checklist Completed: patient identified, site marked, surgical consent, pre-op evaluation, timeout performed, IV checked, risks and benefits discussed and monitors and equipment checked Spinal Block Patient position: sitting Prep: Betadine Patient monitoring: heart rate, continuous pulse ox and blood pressure Approach: midline Location: L3-4 Injection technique: single-shot Needle Needle type: Sprotte  Needle gauge: 24 G Needle length: 10 cm Assessment Sensory level: T6 Additional Notes Expiration date of kit checked and confirmed. Time out performed. Clear CSF pre/post injection on first attempt. Patient tolerated procedure well, without complications.

## 2014-05-31 ENCOUNTER — Encounter (HOSPITAL_COMMUNITY): Payer: Self-pay | Admitting: Orthopedic Surgery

## 2014-05-31 LAB — BASIC METABOLIC PANEL
ANION GAP: 12 (ref 5–15)
BUN: 23 mg/dL (ref 6–23)
CALCIUM: 8.8 mg/dL (ref 8.4–10.5)
CO2: 26 mEq/L (ref 19–32)
CREATININE: 1.22 mg/dL (ref 0.50–1.35)
Chloride: 100 mEq/L (ref 96–112)
GFR calc non Af Amer: 64 mL/min — ABNORMAL LOW (ref >90)
GFR, EST AFRICAN AMERICAN: 74 mL/min — AB (ref >90)
Glucose, Bld: 170 mg/dL — ABNORMAL HIGH (ref 70–99)
Potassium: 5.5 mEq/L — ABNORMAL HIGH (ref 3.7–5.3)
Sodium: 138 mEq/L (ref 137–147)

## 2014-05-31 LAB — CBC
HEMATOCRIT: 32.5 % — AB (ref 39.0–52.0)
Hemoglobin: 11.3 g/dL — ABNORMAL LOW (ref 13.0–17.0)
MCH: 32.1 pg (ref 26.0–34.0)
MCHC: 34.8 g/dL (ref 30.0–36.0)
MCV: 92.3 fL (ref 78.0–100.0)
PLATELETS: 176 10*3/uL (ref 150–400)
RBC: 3.52 MIL/uL — ABNORMAL LOW (ref 4.22–5.81)
RDW: 13.3 % (ref 11.5–15.5)
WBC: 14.4 10*3/uL — ABNORMAL HIGH (ref 4.0–10.5)

## 2014-05-31 LAB — HEMOGLOBIN A1C
Hgb A1c MFr Bld: 5.8 % — ABNORMAL HIGH (ref <5.7)
Mean Plasma Glucose: 120 mg/dL — ABNORMAL HIGH (ref <117)

## 2014-05-31 LAB — GLUCOSE, CAPILLARY
Glucose-Capillary: 222 mg/dL — ABNORMAL HIGH (ref 70–99)
Glucose-Capillary: 182 mg/dL — ABNORMAL HIGH (ref 70–99)

## 2014-05-31 MED ORDER — HYDROCODONE-ACETAMINOPHEN 7.5-325 MG PO TABS: 1.0000 | ORAL_TABLET | ORAL | Status: AC | PRN

## 2014-05-31 MED ORDER — DSS 100 MG PO CAPS: 100.0000 mg | ORAL_CAPSULE | Freq: Two times a day (BID) | ORAL | Status: AC

## 2014-05-31 MED ORDER — METHOCARBAMOL 500 MG PO TABS: 500.0000 mg | ORAL_TABLET | Freq: Four times a day (QID) | ORAL | Status: AC | PRN

## 2014-05-31 MED ORDER — POLYETHYLENE GLYCOL 3350 17 G PO PACK: 17.0000 g | PACK | Freq: Every day | ORAL | Status: AC | PRN

## 2014-05-31 MED ORDER — ASPIRIN 325 MG PO TBEC: 325.0000 mg | DELAYED_RELEASE_TABLET | Freq: Two times a day (BID) | ORAL | Status: AC

## 2014-05-31 NOTE — Evaluation (Signed)
Occupational Therapy Evaluation Patient Details Name: Ricardo Contreras MRN: [id number] DOB: 1956/03/17 Today's Date: 05/31/2014    History of Present Illness 58 yo male s/p R THA-DA 05/30/14   Clinical Impression   Pt doing well. All education completed. Wife present and will be able to assist PRN. No further acute OT needs.    Follow Up Recommendations  No OT follow up    Equipment Recommendations  3 in 1 bedside comode (wide 3in1)    Recommendations for Other Services       Precautions / Restrictions Precautions Precautions: Fall Restrictions Weight Bearing Restrictions: No RLE Weight Bearing: Weight bearing as tolerated      Mobility Bed Mobility                  Transfers Overall transfer level: Needs assistance Equipment used: Rolling walker (2 wheeled) Transfers: Sit to/from Stand Sit to Stand: Min guard         General transfer comment: verbal cues for hand placement to help with safe descent.     Balance                                            ADL Overall ADL's : Needs assistance/impaired Eating/Feeding: Independent;Sitting   Grooming: Wash/dry hands;Set up;Sitting   Upper Body Bathing: Set up;Sitting   Lower Body Bathing: Minimal assistance;Sit to/from stand   Upper Body Dressing : Set up;Sitting   Lower Body Dressing: Moderate assistance;Sit to/from stand   Toilet Transfer: Min guard;Ambulation;BSC;RW   Toileting- Water quality scientist and Hygiene: Min guard;Sit to/from stand         General ADL Comments: Attempted to practice similar tub transfer to his at home but pt not able to let go of walker and try to step over tub surface without a grab bar currently. Pt only has a suction grab bar and he isnt sure if it will hold him. Feel he may benefit from sponge bathing a few days initialy and pt agreeable. Reviewed sequence for stepping in and out of tub however an demonstrated for pt. Also discussed  sequence  for LB dressing. Demonstrated all AE and pt is interested in obtaining AE kit. Wife plans to purchase kit from gift shop and she is available to assist also.      Vision                     Perception     Praxis      Pertinent Vitals/Pain Pain Assessment: 0-10 Pain Score: 5  Pain Location: R hip Pain Descriptors / Indicators: Sore Pain Intervention(s): Repositioned;Monitored during session     Hand Dominance     Extremity/Trunk Assessment Upper Extremity Assessment Upper Extremity Assessment: Overall WFL for tasks assessed           Communication Communication Communication: No difficulties   Cognition Arousal/Alertness: Awake/alert Behavior During Therapy: WFL for tasks assessed/performed Overall Cognitive Status: Within Functional Limits for tasks assessed                     General Comments       Exercises       Shoulder Instructions      Home Living Family/patient expects to be discharged to:: Private residence Living Arrangements: Spouse/significant other   Type of Home: House Home Access: Stairs to enter [employer] of Steps:  2   Home Layout: One level     Bathroom Shower/Tub: Teacher, early years/pre: Standard     Home Equipment: Walker - standard;Cane - single point          Prior Functioning/Environment Level of Independence: Independent             OT Diagnosis: Generalized weakness   OT Problem List:     OT Treatment/Interventions:      OT Goals(Current goals can be found in the care plan section) Acute Rehab OT Goals Patient Stated Goal: home OT Goal Formulation: With patient/family  OT Frequency:     Barriers to D/C:            Co-evaluation              End of Session Equipment Utilized During Treatment: Rolling walker  Activity Tolerance: Patient tolerated treatment well Patient left: in chair;with call bell/phone within reach;with family/visitor present   Time:  0905-0928 OT Time Calculation (min): 23 min Charges:  OT General Charges $OT Visit: 1 Procedure OT Evaluation $Initial OT Evaluation Tier I: 1 Procedure OT Treatments $Therapeutic Activity: 8-22 mins G-Codes:    Jules Schick [phone] 05/31/2014, 10:13 AM

## 2014-05-31 NOTE — Progress Notes (Signed)
Physical Therapy Treatment Patient Details Name: Ricardo Contreras Rayos MRN: [id number] DOB: [birth year] Today's Date: 05/31/2014    History of Present Illness 58 yo male s/p R THA-DA 05/30/14    PT Comments    Progressing well with mobility. Some increased pain this session-pt rated 7/10 but he was able to participate well. Practiced ambulation, exercises, and stair negotiation. All education completed.   Follow Up Recommendations  Home health PT     Equipment Recommendations  Rolling walker with 5" wheels    Recommendations for Other Services OT consult     Precautions / Restrictions Precautions Precautions: Fall Restrictions Weight Bearing Restrictions: No RLE Weight Bearing: Weight bearing as tolerated    Mobility  Bed Mobility                  Transfers Overall transfer level: Needs assistance Equipment used: Rolling walker (2 wheeled) Transfers: Sit to/from Stand Sit to Stand: Min guard         General transfer comment: close guard for safety.   Ambulation/Gait Ambulation/Gait assistance: Min guard Ambulation Distance (Feet): 100 Feet   Gait Pattern/deviations: Step-through pattern;Decreased stride length;Step-to pattern     General Gait Details: slow gait speed. VCS safety, technique, sequence   Stairs Stairs: Yes Stairs assistance: Min assist Stair Management: Two rails;Step to pattern;Forwards Number of Stairs: 2 General stair comments: VCs safety, technique, sequence. small amount of assist to stabilize.   Wheelchair Mobility    Modified Rankin (Stroke Patients Only)       Balance                                    Cognition Arousal/Alertness: Awake/alert Behavior During Therapy: WFL for tasks assessed/performed Overall Cognitive Status: Within Functional Limits for tasks assessed                      Exercises Total Joint Exercises Ankle Circles/Pumps: AROM;Both;10 reps;Seated Quad Sets: AROM;Both;10  reps;Seated Heel Slides: AAROM;Right;10 reps;Seated Hip ABduction/ADduction: AAROM;Right;10 reps;Seated    General Comments        Pertinent Vitals/Pain Pain Assessment: 0-10 Pain Score: 7  Pain Location: R hip Pain Descriptors / Indicators: Sore Pain Intervention(s): Monitored during session;Ice applied    Home Living Family/patient expects to be discharged to:: Private residence Living Arrangements: Spouse/significant other   Type of Home: House Home Access: Stairs to enter   Home Layout: One level Home Equipment: Environmental consultantWalker - standard;Cane - single point      Prior Function Level of Independence: Independent          PT Goals (current goals can now be found in the care plan section) Acute Rehab PT Goals Patient Stated Goal: home Progress towards PT goals: Progressing toward goals    Frequency  7X/week    PT Plan Current plan remains appropriate    Co-evaluation             End of Session Equipment Utilized During Treatment: Gait belt Activity Tolerance: Patient tolerated treatment well Patient left: in chair;with call bell/phone within reach;with family/visitor present     Time: 7829-56211009-1025 PT Time Calculation (min): 16 min  Charges:  $Gait Training: 8-22 mins                    G Codes:      Rebeca AlertJannie Lindsee Labarre, MPT Pager: [phone]

## 2014-05-31 NOTE — Care Management Note (Signed)
    Page 1 of 2   05/31/2014     12:48:25 PM CARE MANAGEMENT NOTE 05/31/2014  Patient:  Ricardo Contreras, Ricardo Contreras   Account Number:  [id number]  Date Initiated:  05/31/2014  Documentation initiated by:  [facility]  Subjective/Objective Assessment:   adm: RIGHT TOTAL HIP ARTHROPLASTY ANTERIOR APPROACH (Right)     Action/Plan:   discharge planning   Anticipated DC Date:  05/31/2014   Anticipated DC Plan:  [place]  CM consult      [facility] Choice  HOME HEALTH   Choice offered to / List presented to:  C-1 Patient   DME arranged  OTHER - SEE COMMENT      DME agency  [place] arranged  [place]   Status of service:  Completed, signed off Medicare Important Message given?   (If response is "NO", the following Medicare IM given date fields will be blank) Date Medicare IM given:   Medicare IM given by:   Date Additional Medicare IM given:   Additional Medicare IM given by:    Discharge Disposition:  [place]  Per UR Regulation:  Reviewed for med. necessity/level of care/duration of stay  If discussed at [place] of Stay Meetings, dates discussed:    Comments:  05/31/14 08:15 CM met with pt and pt's spouse Ricardo Contreras [phone] in room to confirm choice of [facility] to render HHPT.  Pt confirms.  Address and contact information verified with pt.  CM called [facility] DME rep to deliver Bari-walker to room and Pura Spice states she will have to ship [place] 3n1 to pt's home address.  CM notified Pt of DME plan.  Referral call made to [facility] rep, Stanton Kidney to notify of discharge today.  Not other CM needs were communicated. Ricardo Contreras, BSN, CM [phone].

## 2014-05-31 NOTE — Progress Notes (Signed)
     Subjective: 1 Day Post-Op Procedure(s) (LRB): RIGHT TOTAL HIP ARTHROPLASTY ANTERIOR APPROACH (Right)   Seen by Dr. Charlann Boxerlin. Patient reports pain as mild, pain well controlled. No events throughout the night. Ready to be discharged home after PT.  Objective:   VITALS:   Filed Vitals:   05/31/14 0445  BP: 135/76  Pulse: 65  Temp: 97.8 F (36.6 C)  Resp: 20    Dorsiflexion/Plantar flexion intact Incision: dressing C/D/I No cellulitis present Compartment soft  LABS  Recent Labs  05/31/14 0500  HGB 11.3*  HCT 32.5*  WBC 14.4*  PLT 176     Recent Labs  05/31/14 0500  NA 138  K 5.5*  BUN 23  CREATININE 1.22  GLUCOSE 170*     Assessment/Plan: 1 Day Post-Op Procedure(s) (LRB): RIGHT TOTAL HIP ARTHROPLASTY ANTERIOR APPROACH (Right) Foley cath d/c'ed Advance diet Up with therapy D/C IV fluids Discharge home with home health [facility]. Follow up with OLIN,Eiliyah Reh D in 2 weeks.  Contact information:  [facility] [address] [place] [id number] [phone]    Morbid Obesity (BMI >40)  Estimated body mass index is 44.58 kg/(m^2) as calculated from the following:   Height as of this encounter: 5\' 9"  (1.753 m).   Weight as of this encounter: 136.986 kg (302 lb). Patient also counseled that weight may inhibit the healing process Patient counseled that losing weight will help with future health issues       Anastasio AuerbachMatthew S. Kindra Bickham   PAC  05/31/2014, 8:41 AM

## 2014-05-31 NOTE — Progress Notes (Signed)
Clinical Social Work  CSW inappropriate referral in order to assist with SNF placement. CSW reviewed chart which stated that PT is recommending HH. CSW is signing off but available if needed.  Unk LightningHolly Nishat Livingston, LCSW (Coverage for [employer])

## 2014-06-02 NOTE — Discharge Summary (Signed)
Physician Discharge Summary  Patient ID: Ricardo Contreras MRN: [id number] DOB/AGE: [birth year] 58 y.o.  Admit date: 05/30/2014 Discharge date: 05/31/2014   Procedures:  Procedure(s) (LRB): RIGHT TOTAL HIP ARTHROPLASTY ANTERIOR APPROACH (Right)  Attending Physician: Ricardo Contreras Dr. Durene RomansMatthew Olin   Admission Diagnoses:   Right hip OA / pain  Discharge Diagnoses:  Principal Problem:   S/P right THA, AA Active Problems:   Morbid obesity  Past Medical History  Diagnosis Date  . Diabetes mellitus without complication   . Hypertension   . Heart murmur     hx of as a child   . Kidney stones   . GERD (gastroesophageal reflux disease)   . Arthritis     HPI: Ricardo Masonlbert L Contreras, 58 y.o. male, has a history of pain and functional disability in the right hip(s) due to arthritis and patient has failed non-surgical conservative treatments for greater than 12 weeks to include NSAID's and/or analgesics, use of assistive devices and activity modification. Onset of symptoms was gradual starting >10 years ago with gradually worsening course since that time.The patient noted no past surgery on the right hip(s). Patient currently rates pain in the right hip at 8 out of 10 with activity. Patient has night pain, worsening of pain with activity and weight bearing, trendelenberg gait, pain that interfers with activities of daily living and pain with passive range of motion. Patient has evidence of periarticular osteophytes and joint space narrowing by imaging studies. This condition presents safety issues increasing the risk of falls. There is no current active infection. Risks, benefits and expectations were discussed with the patient. Risks including but not limited to the risk of anesthesia, blood clots, nerve damage, blood vessel damage, failure of the prosthesis, infection and up to and including death. Patient understand the risks, benefits and expectations and wishes to proceed with surgery.  PCP:  Mattie MarlinWILLETT,RALPH, MD   Discharged Condition: good  Hospital Course:  Patient underwent the above stated procedure on 05/30/2014. Patient tolerated the procedure well and brought to the recovery room in good condition and subsequently to the floor.  POD #1 BP: 135/76 ; Pulse: 65 ; Temp: 97.8 F (36.6 C) ; Resp: 20 Patient reports pain as mild, pain well controlled. No events throughout the night. Ready to be discharged home after PT. Dorsiflexion/plantar flexion intact, incision: dressing C/Contreras/I, no cellulitis present and compartment soft.   LABS  Basename    HGB  11.3  HCT  32.5    Discharge Exam: General appearance: alert, cooperative and no distress Extremities: Homans sign is negative, no sign of DVT, no edema, redness or tenderness in the calves or thighs and no ulcers, gangrene or trophic changes  Disposition: Home with follow up in 2 weeks   Follow-up Information   Follow up with Ricardo Contreras,Ricardo Zwahlen D, MD. Schedule an appointment as soon as possible for a visit in 2 weeks.   Specialty:  Orthopedic Surgery   Contact information:   [address] [place] [address] [phone]       [facility] ([facility])    Contact information:   [address] [place] [id number] [phone]       Follow up with [facility]. (3n1 will be delivered to home; rollingw walker delivered to room)    Contact information:   [address] [place] [address] [phone]       Discharge Instructions   Call MD /  Call 911    Complete by:  As directed   If you experience chest pain or shortness of breath, CALL 911 and be transported to the hospital emergency room.  If you develope a fever above 101 F, pus (white drainage) or increased drainage or redness at the wound, or calf pain, call your surgeon's office.     Change dressing    Complete by:  As directed   Maintain surgical dressing for  10-14 days, or until follow up in the clinic.     Constipation Prevention    Complete by:  As directed   Drink plenty of fluids.  Prune juice may be helpful.  You may use a stool softener, such as Colace (over the counter) 100 mg twice a day.  Use MiraLax (over the counter) for constipation as needed.     Diet - low sodium heart healthy    Complete by:  As directed      Discharge instructions    Complete by:  As directed   Maintain surgical dressing for 10-14 days, or until follow up in the clinic. [facility]. Call with any questions or concerns.     Increase activity slowly as tolerated    Complete by:  As directed      TED hose    Complete by:  As directed   Use stockings (TED hose) for 2 weeks on both leg(s).  You may remove them at night for sleeping.     Weight bearing as tolerated    Complete by:  As directed   Laterality:  right  Extremity:  Lower             Medication List         aspirin 325 MG EC tablet  Take 1 tablet (325 mg total) by mouth 2 (two) times daily.     cetirizine 10 MG tablet  Commonly known as:  ZYRTEC  Take 10 mg by mouth at bedtime.     DSS 100 MG Caps  Take 100 mg by mouth 2 (two) times daily.     etodolac 500 MG tablet  Commonly known as:  LODINE  Take 500 mg by mouth 2 (two) times daily.     gemfibrozil 600 MG tablet  Commonly known as:  LOPID  Take 600 mg by mouth 2 (two) times daily before a meal.     HYDROcodone-acetaminophen 7.5-325 MG per tablet  Commonly known as:  NORCO  Take 1-2 tablets by mouth every 4 (four) hours as needed for moderate pain.     lisinopril 20 MG tablet  Commonly known as:  PRINIVIL,ZESTRIL  Take 20 mg by mouth every evening.     metFORMIN 1000 MG tablet  Commonly known as:  GLUCOPHAGE  Take 1,000 mg by mouth 2 (two) times daily with a meal.     methocarbamol 500 MG tablet  Commonly known as:  ROBAXIN  Take 1 tablet (500 mg total) by mouth every 6 (six) hours as  needed for muscle spasms.     polyethylene glycol packet  Commonly known as:  MIRALAX / GLYCOLAX  Take 17 g by mouth daily as needed for mild constipation.     pregabalin 50 MG capsule  Commonly known as:  LYRICA  Take 50 mg by mouth 2 (two) times daily.     sitaGLIPtin 50 MG tablet  Commonly known as:  JANUVIA  Take 50 mg by mouth 2 (two) times daily.  Signed: Anastasio Auerbach. Valdis Bevill   PA-C  06/02/2014, 7:47 AM

## 2015-03-20 IMAGING — CR DG CHEST 2V
2 series · 2 of 2 positions shown · non-contrast
Comparison: None.

CLINICAL DATA: Preoperative films. Patient for right hip
replacement.

EXAM:
CHEST  2 VIEW

[w chest pa]
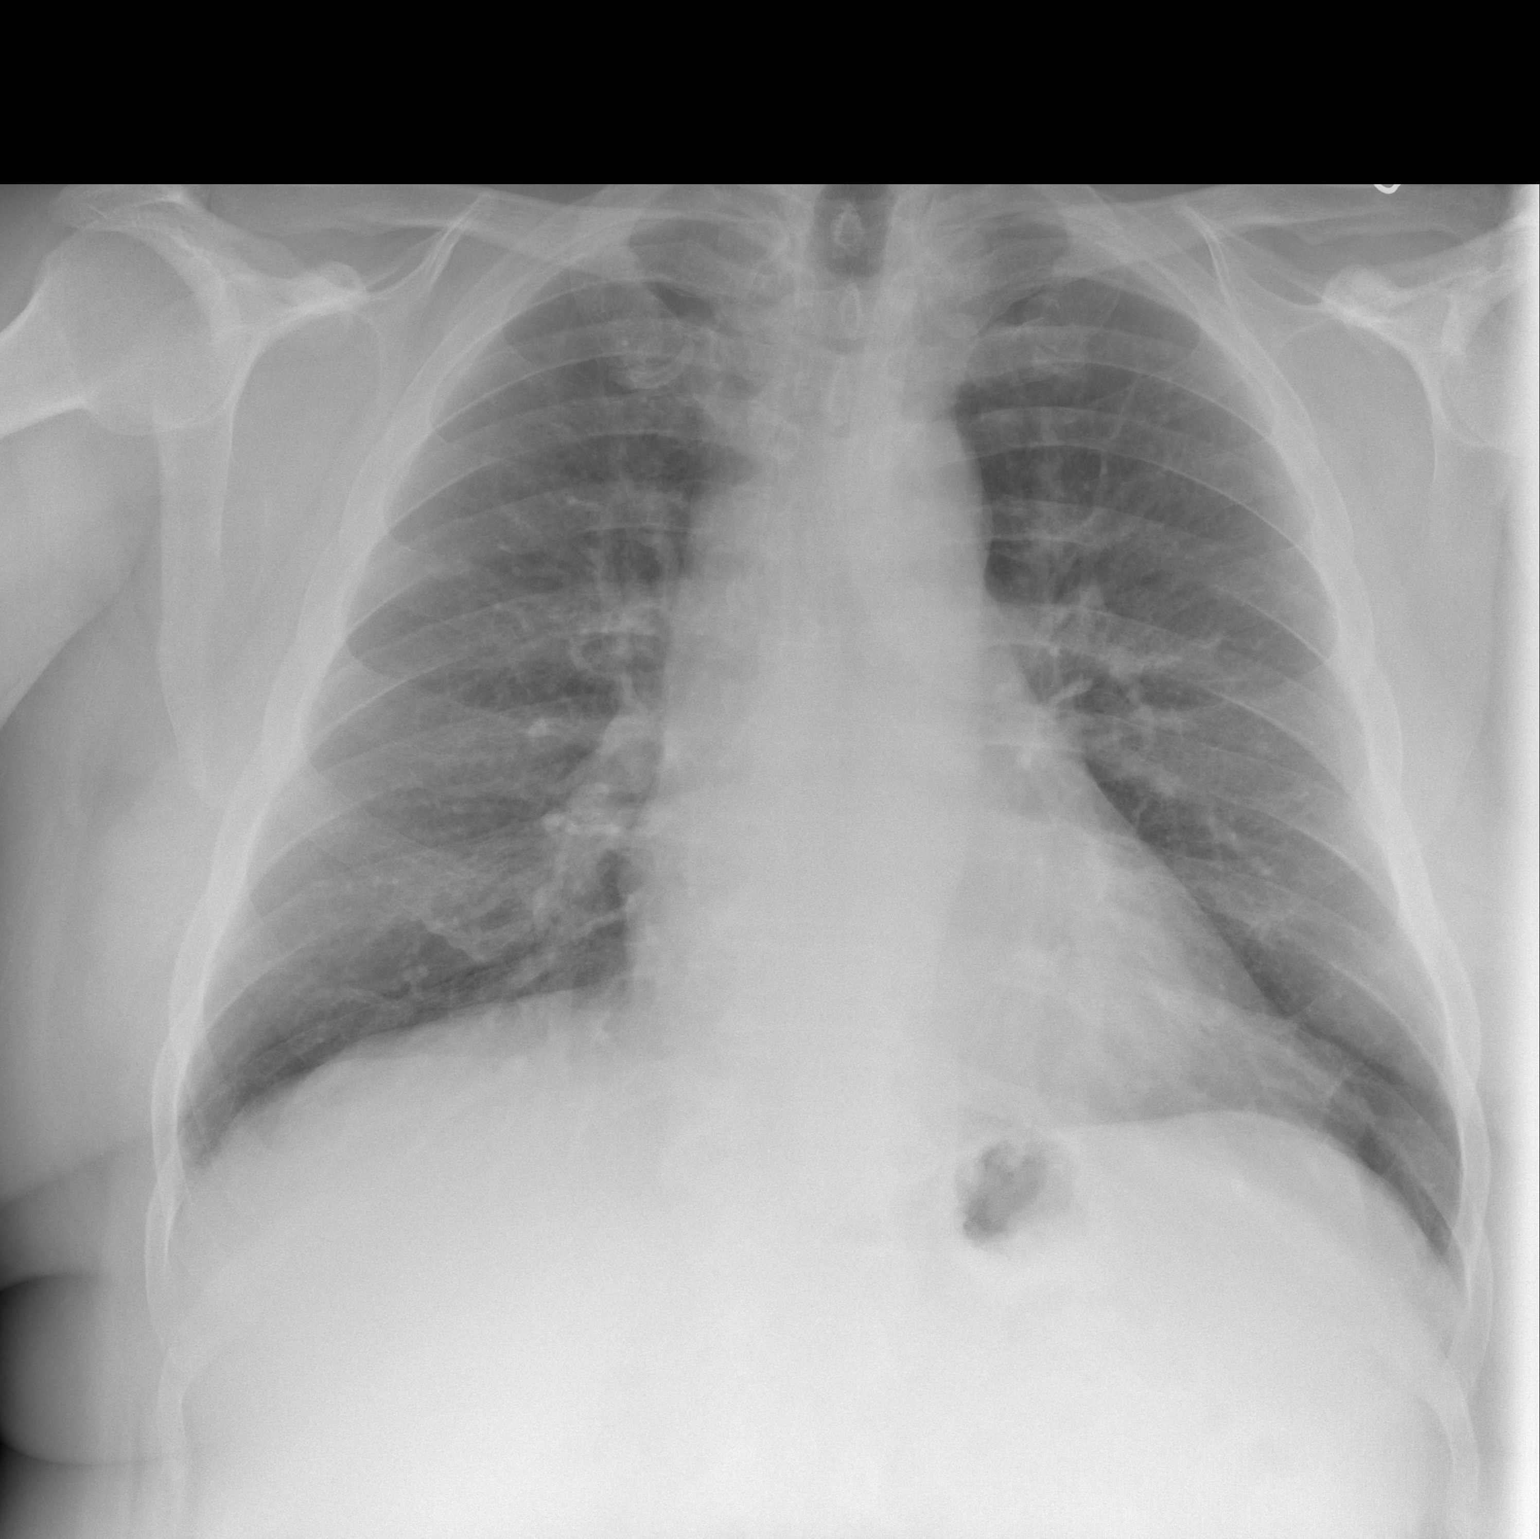

[w chest lat]
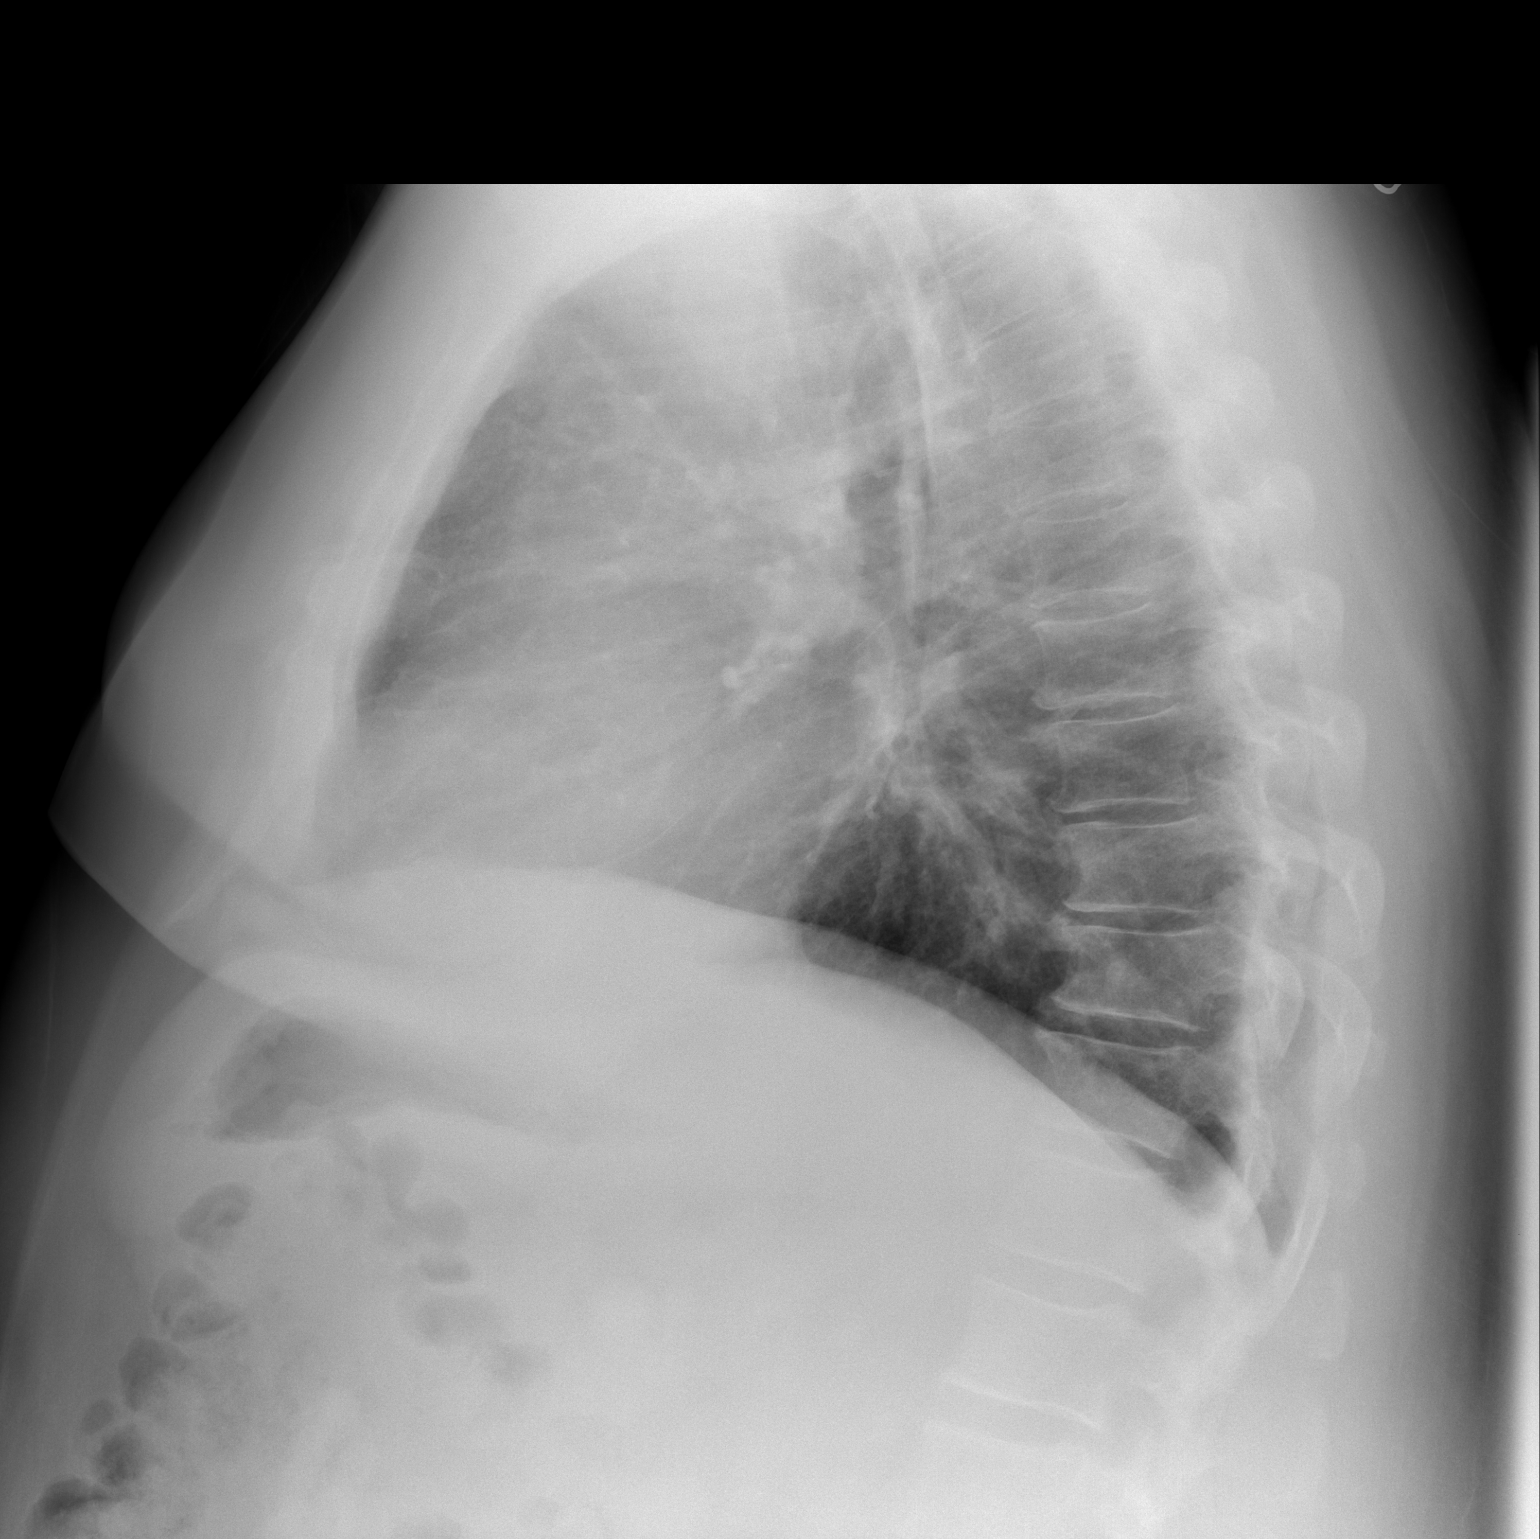

[2 of 2 positions shown; findings below may reference images not displayed]

FINDINGS: The lungs are clear. Heart size is normal. No pneumothorax or
pleural effusion. No focal bony abnormality.
IMPRESSION: No acute disease.

## 2015-03-27 IMAGING — DX DG PORTABLE PELVIS
2 series · 2 of 2 positions shown · non-contrast
Comparison: None.

CLINICAL DATA: Osteoarthritis of the right hip. Status post right
total hip prosthesis insertion.

EXAM:
PORTABLE PELVIS 1-2 VIEWS

[pelvis ap (1 of 2)]
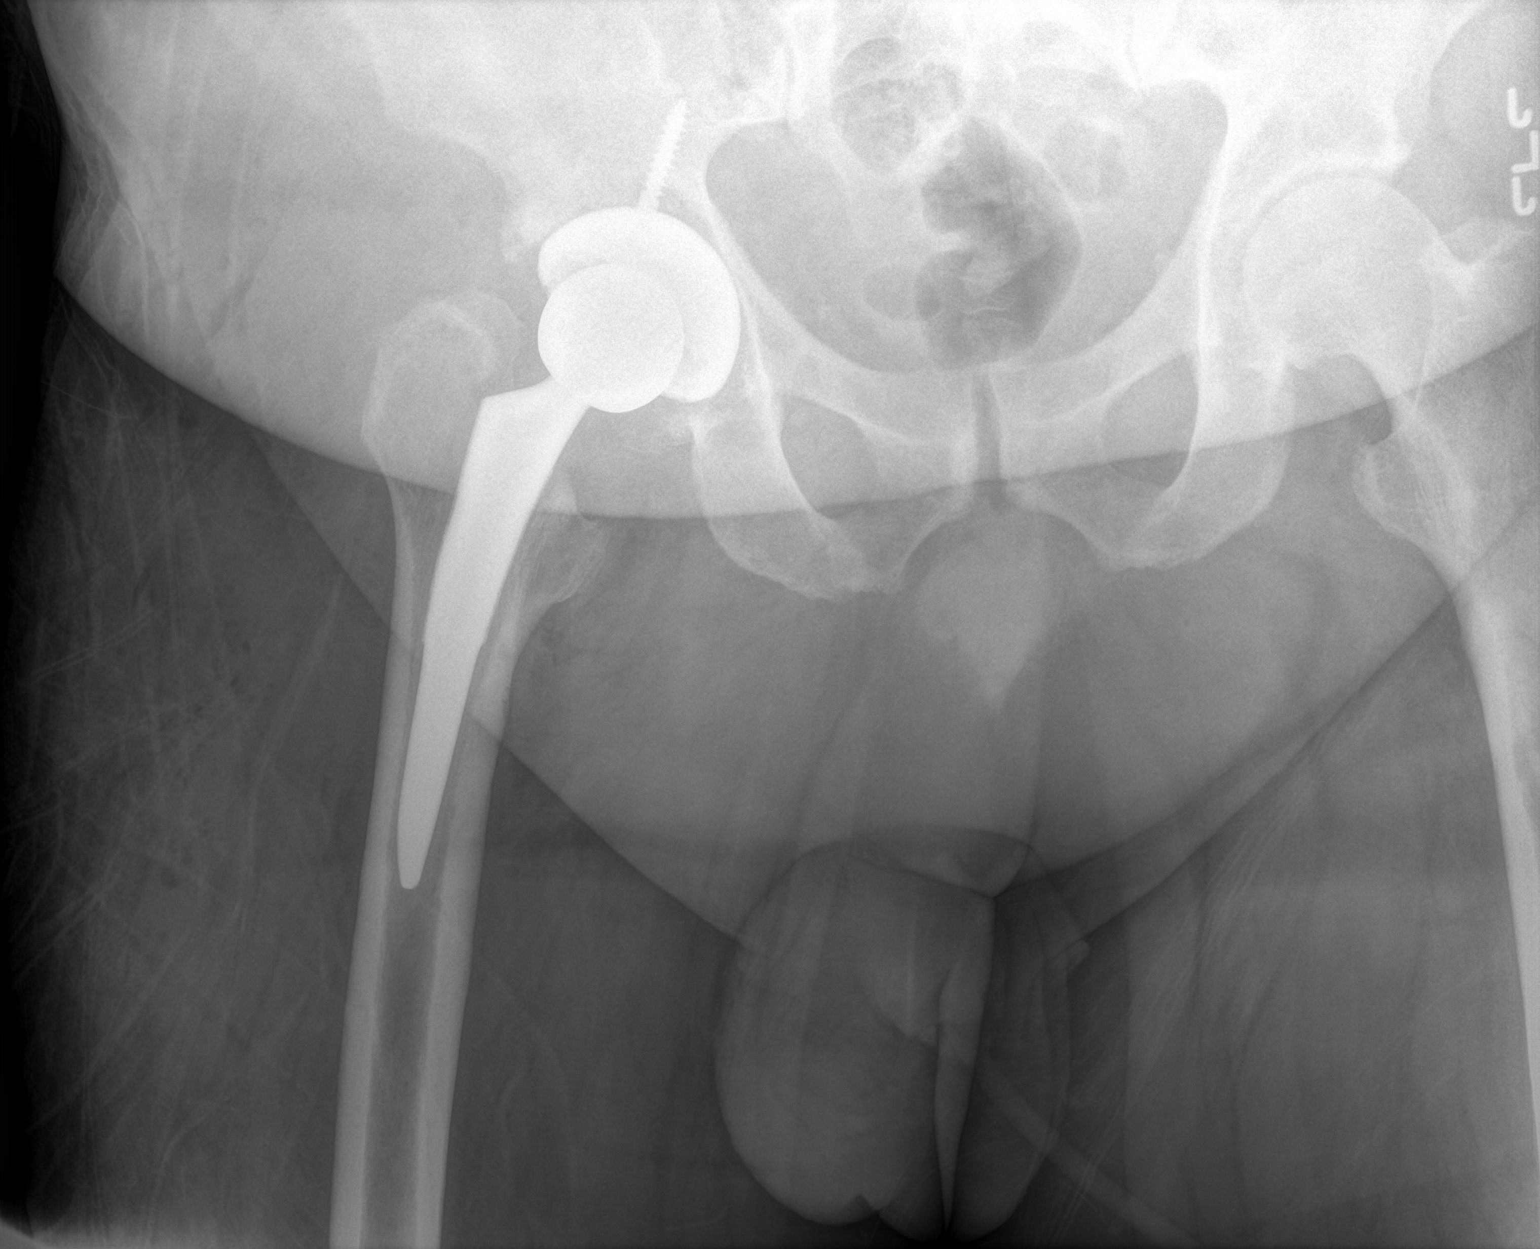

[pelvis ap (2 of 2)]
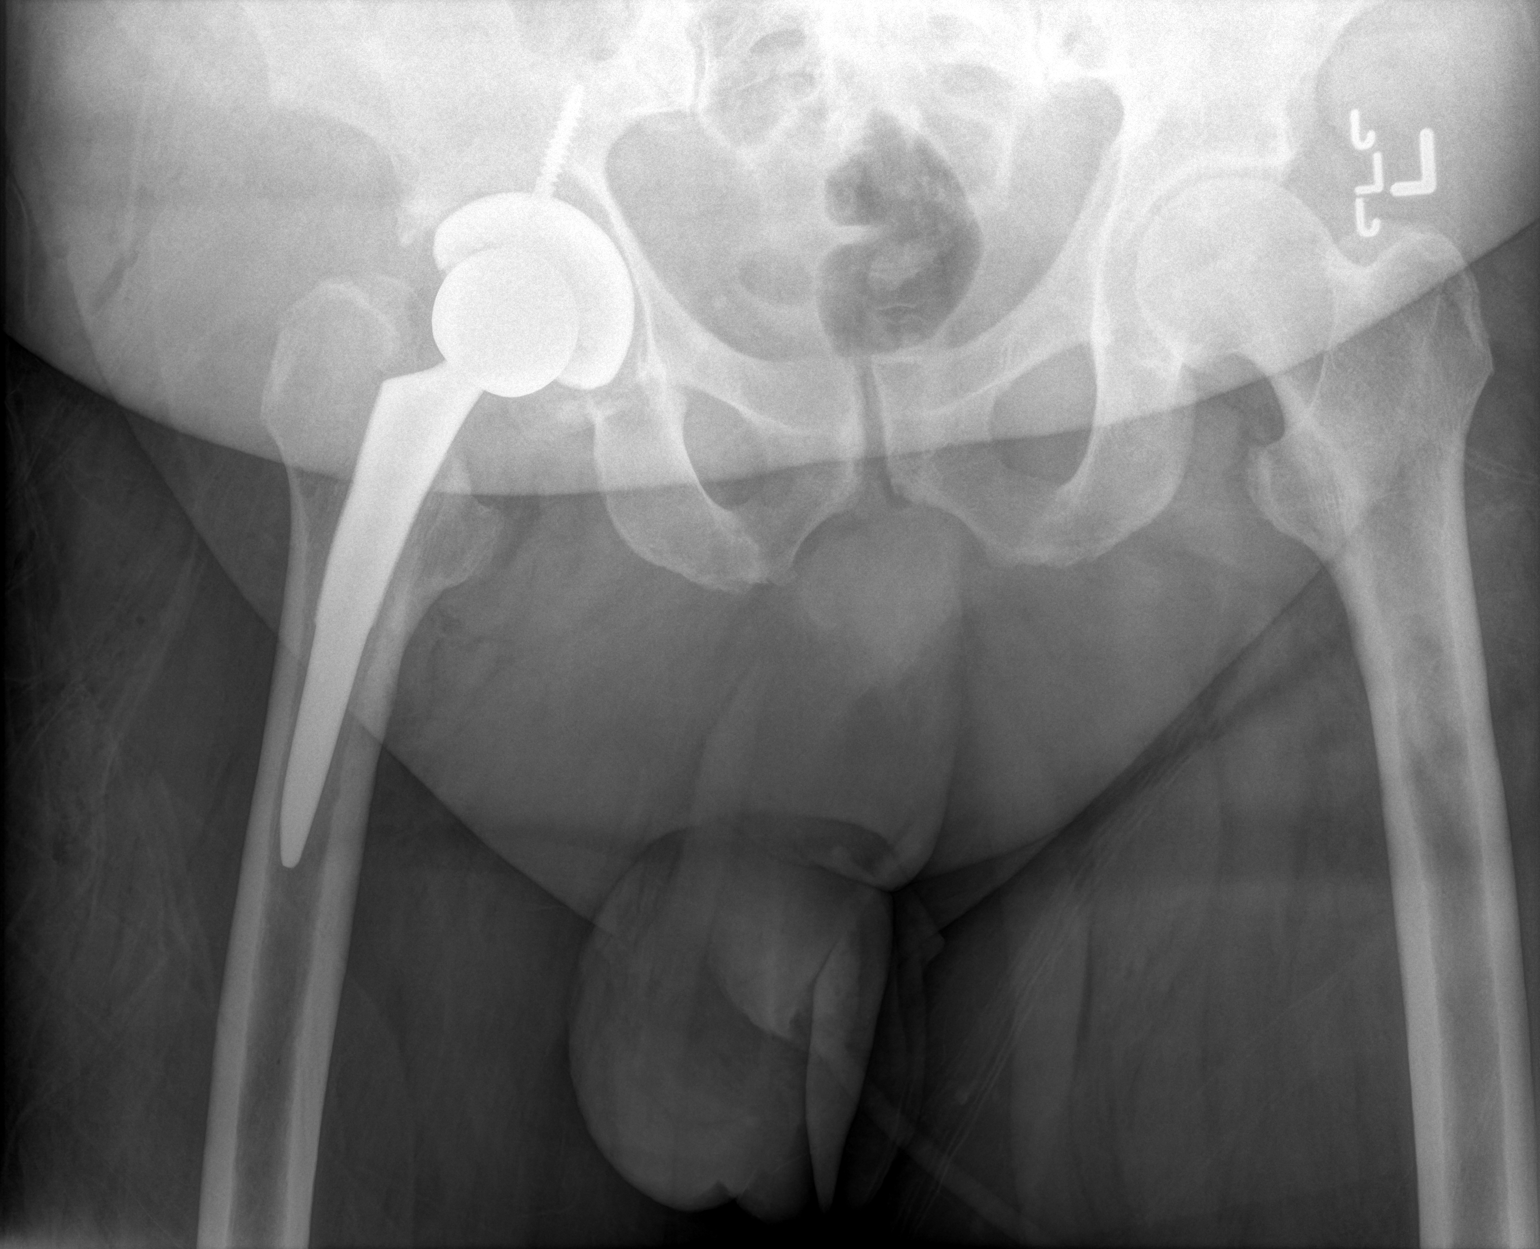

[2 of 2 positions shown; findings below may reference images not displayed]

FINDINGS: Acetabular and femoral components of the right hip prosthesis appear
in excellent position in the AP projection. No fractures.
IMPRESSION: Satisfactory appearance of the right hip in the AP projection after
total hip prosthesis insertion.

## 2015-03-27 IMAGING — DX DG HIP 1V PORT*R*
1 series · 1 of 1 positions shown · non-contrast
Comparison: None.

CLINICAL DATA: Right total hip replacement, postop

EXAM:
DG C-ARM 1-60 MIN - [facility]; PORTABLE RIGHT HIP - 1 VIEW

[hip x-table]
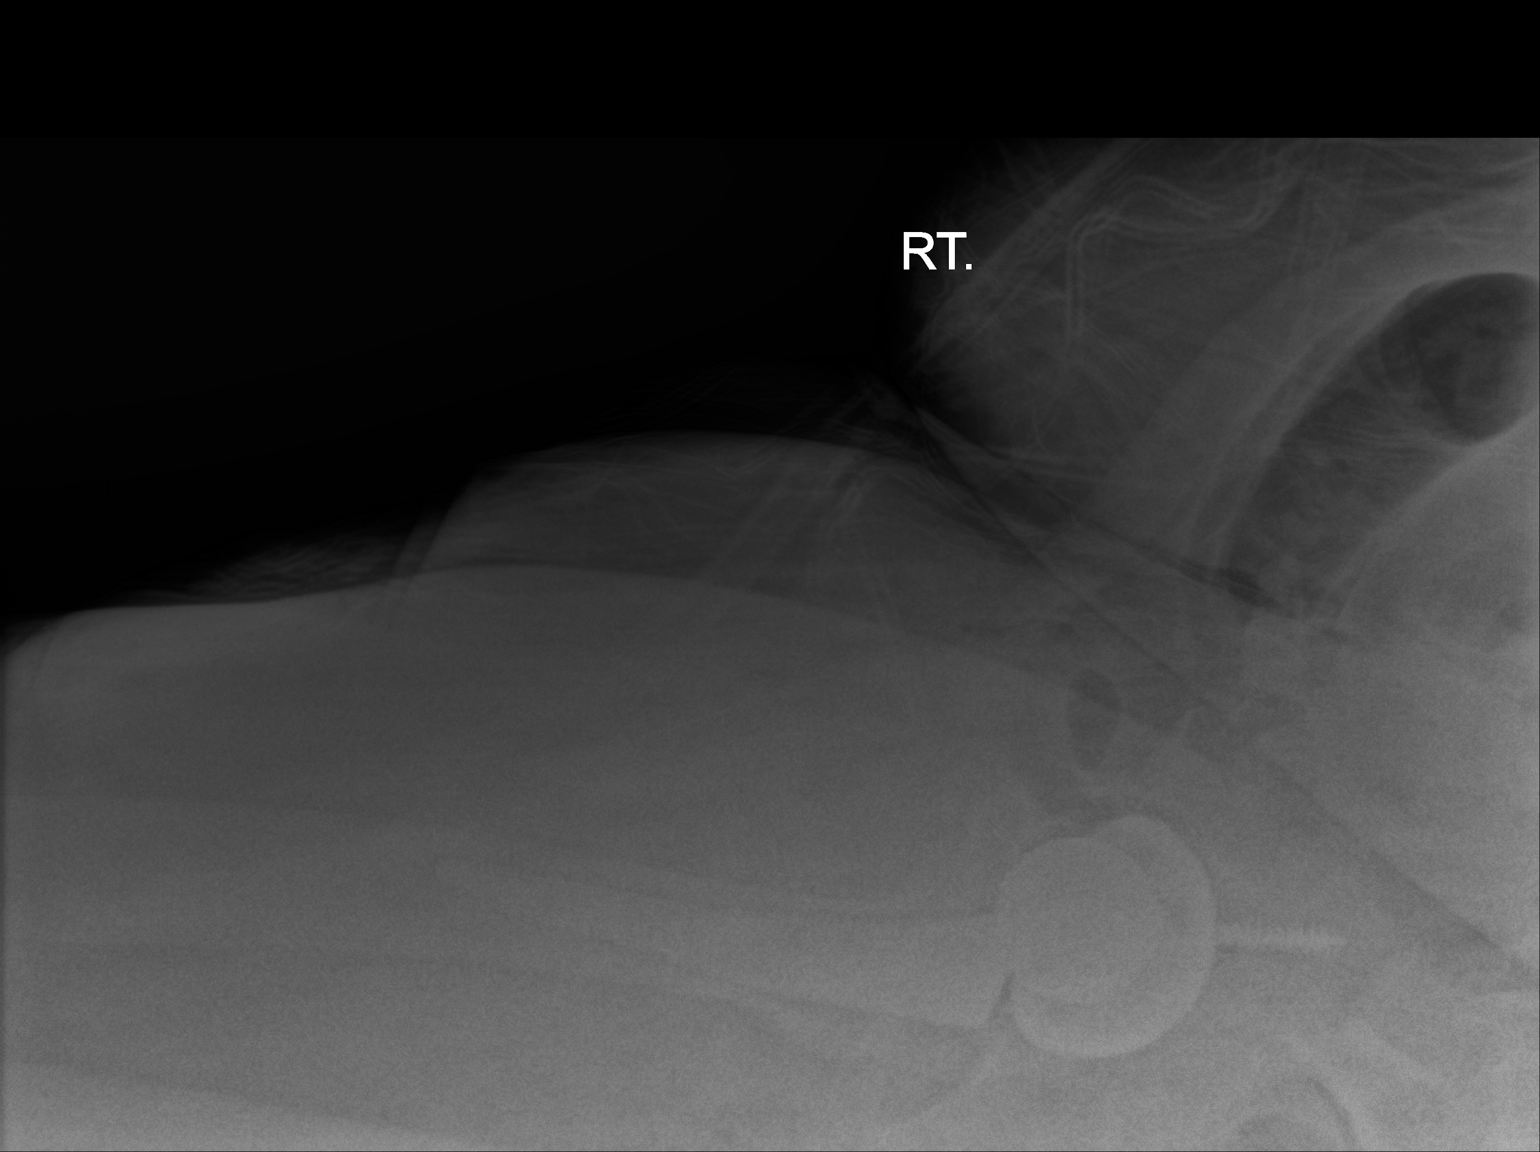

[1 of 1 positions shown; findings below may reference images not displayed]

FINDINGS: Right total hip arthroplasty without failure or complication. No
dislocation. Postsurgical changes in the surrounding soft tissues.
IMPRESSION: Right total hip arthroplasty without dislocation.

## 2019-10-22 ENCOUNTER — Ambulatory Visit: Payer: Self-pay | Attending: Internal Medicine

## 2019-10-22 DIAGNOSIS — Z23 Encounter for immunization: Secondary | ICD-10-CM | POA: Insufficient documentation

## 2019-10-22 NOTE — Progress Notes (Signed)
   Covid-19 Vaccination Clinic  Name:  Ricardo Contreras    MRN: [id number] DOB: [birth year]  10/22/2019  [name] was observed post Covid-19 immunization for 15 minutes without incident. He was provided with Vaccine Information Sheet and instruction to access the V-Safe system.   [name] was instructed to call 911 with any severe reactions post vaccine: Marland Kitchen Difficulty breathing  . Swelling of face and throat  . A fast heartbeat  . A bad rash all over body  . Dizziness and weakness   Immunizations Administered    Name Date Dose VIS Date Route   Pfizer COVID-19 Vaccine 10/22/2019  5:26 PM 0.3 mL 07/29/2019 Intramuscular   Manufacturer: [employer], [employer]   Lot: [id number]   NDC: [address]-7

## 2019-11-12 ENCOUNTER — Ambulatory Visit: Payer: Self-pay | Attending: Internal Medicine

## 2019-11-12 DIAGNOSIS — Z23 Encounter for immunization: Secondary | ICD-10-CM

## 2019-11-12 NOTE — Progress Notes (Signed)
   Covid-19 Vaccination Clinic  Name:  SIMONE TUCKEY    MRN: [id number] DOB: November 07, 1955  11/12/2019  Mr. Furio was observed post Covid-19 immunization for 15 minutes without incident. He was provided with Vaccine Information Sheet and instruction to access the V-Safe system.   Mr. Soter was instructed to call 911 with any severe reactions post vaccine: Marland Kitchen Difficulty breathing  . Swelling of face and throat  . A fast heartbeat  . A bad rash all over body  . Dizziness and weakness   Immunizations Administered    Name Date Dose VIS Date Route   Pfizer COVID-19 Vaccine 11/12/2019 12:34 PM 0.3 mL 07/29/2019 Intramuscular   Manufacturer: [employer], [employer]   Lot: [id number]   NDC: [address]-7

## 2019-11-22 ENCOUNTER — Ambulatory Visit: Payer: Self-pay

## 2024-08-18 DEATH — deceased
